# Patient Record
Sex: Female | Born: 1995 | Race: White | Hispanic: No | Marital: Single | Smoking: Former smoker
Health system: Southern US, Community
[De-identification: ages and names within clinical notes are randomized; demographics above are authoritative.]

## PROBLEM LIST (undated history)

## (undated) ENCOUNTER — Inpatient Hospital Stay (HOSPITAL_COMMUNITY): Payer: Self-pay

## (undated) DIAGNOSIS — F319 Bipolar disorder, unspecified: Secondary | ICD-10-CM

## (undated) DIAGNOSIS — F329 Major depressive disorder, single episode, unspecified: Secondary | ICD-10-CM

## (undated) DIAGNOSIS — F419 Anxiety disorder, unspecified: Secondary | ICD-10-CM

## (undated) DIAGNOSIS — F32A Depression, unspecified: Secondary | ICD-10-CM

## (undated) DIAGNOSIS — O1500 Eclampsia in pregnancy, unspecified trimester: Secondary | ICD-10-CM

## (undated) HISTORY — PX: WISDOM TOOTH EXTRACTION: SHX21

## (undated) HISTORY — DX: Eclampsia complicating pregnancy, unspecified trimester: O15.00

## (undated) HISTORY — DX: Anxiety disorder, unspecified: F41.9

## (undated) HISTORY — DX: Major depressive disorder, single episode, unspecified: F32.9

## (undated) HISTORY — DX: Depression, unspecified: F32.A

## (undated) HISTORY — DX: Bipolar disorder, unspecified: F31.9

---
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
## 2013-08-07 ENCOUNTER — Other Ambulatory Visit: Payer: Self-pay | Admitting: *Deleted

## 2013-08-07 MED ORDER — NORELGESTROMIN-ETH ESTRADIOL 150-35 MCG/24HR TD PTWK: MEDICATED_PATCH | TRANSDERMAL | Status: DC

## 2013-08-07 NOTE — Telephone Encounter (Signed)
LAST OV 10/13 WITH [facility]FS. I ONLY GAVE ONE PACKAGE. PLEASE PRINT FOR MAIL ORDER AND CHANGE QUANTITY IF YOU WANT TO GIVE MORE SINCE MAIL ORDER. THANKS.

## 2014-04-01 DIAGNOSIS — F319 Bipolar disorder, unspecified: Secondary | ICD-10-CM | POA: Insufficient documentation

## 2015-04-02 ENCOUNTER — Ambulatory Visit: Payer: Commercial Managed Care - PPO | Admitting: Sports Medicine

## 2015-10-12 ENCOUNTER — Encounter: Payer: Self-pay | Admitting: Family

## 2015-10-12 ENCOUNTER — Ambulatory Visit (INDEPENDENT_AMBULATORY_CARE_PROVIDER_SITE_OTHER): Payer: 59 | Admitting: Family

## 2015-10-12 VITALS — BP 124/72 | HR 89 | Ht 64.0 in | Wt 120.0 lb

## 2015-10-12 DIAGNOSIS — Z3491 Encounter for supervision of normal pregnancy, unspecified, first trimester: Secondary | ICD-10-CM

## 2015-10-12 DIAGNOSIS — Z3401 Encounter for supervision of normal first pregnancy, first trimester: Secondary | ICD-10-CM

## 2015-10-12 DIAGNOSIS — Z124 Encounter for screening for malignant neoplasm of cervix: Secondary | ICD-10-CM | POA: Diagnosis not present

## 2015-10-12 DIAGNOSIS — Z113 Encounter for screening for infections with a predominantly sexual mode of transmission: Secondary | ICD-10-CM

## 2015-10-12 DIAGNOSIS — Z3400 Encounter for supervision of normal first pregnancy, unspecified trimester: Secondary | ICD-10-CM | POA: Insufficient documentation

## 2015-10-12 NOTE — Progress Notes (Signed)
Pt here for initial prenatal visit and desires [facility]  Bedside US shows single IUP measuring 8047w3d with FHR 185

## 2015-10-12 NOTE — Patient Instructions (Signed)
First Trimester of Pregnancy The first trimester of pregnancy is from week 1 until the end of week 12 (months 1 through 3). A week after a sperm fertilizes an egg, the egg will implant on the wall of the uterus. This embryo will begin to develop into a baby. Genes from you and your partner are forming the baby. The female genes determine whether the baby is a boy or a girl. At 6-8 weeks, the eyes and face are formed, and the heartbeat can be seen on ultrasound. At the end of 12 weeks, all the baby's organs are formed.  Now that you are pregnant, you will want to do everything you can to have a healthy baby. Two of the most important things are to get good prenatal care and to follow your health care provider's instructions. Prenatal care is all the medical care you receive before the baby's birth. This care will help prevent, find, and treat any problems during the pregnancy and childbirth. BODY CHANGES Your body goes through many changes during pregnancy. The changes vary from woman to woman.   You may gain or lose a couple of pounds at first.  You may feel sick to your stomach (nauseous) and throw up (vomit). If the vomiting is uncontrollable, call your health care provider.  You may tire easily.  You may develop headaches that can be relieved by medicines approved by your health care provider.  You may urinate more often. Painful urination may mean you have a bladder infection.  You may develop heartburn as a result of your pregnancy.  You may develop constipation because certain hormones are causing the muscles that push waste through your intestines to slow down.  You may develop hemorrhoids or swollen, bulging veins (varicose veins).  Your breasts may begin to grow larger and become tender. Your nipples may stick out more, and the tissue that surrounds them (areola) may become darker.  Your gums may bleed and may be sensitive to brushing and flossing.  Dark spots or blotches (chloasma,  mask of pregnancy) may develop on your face. This will likely fade after the baby is born.  Your menstrual periods will stop.  You may have a loss of appetite.  You may develop cravings for certain kinds of food.  You may have changes in your emotions from day to day, such as being excited to be pregnant or being concerned that something may go wrong with the pregnancy and baby.  You may have more vivid and strange dreams.  You may have changes in your hair. These can include thickening of your hair, rapid growth, and changes in texture. Some women also have hair loss during or after pregnancy, or hair that feels dry or thin. Your hair will most likely return to normal after your baby is born. WHAT TO EXPECT AT YOUR PRENATAL VISITS During a routine prenatal visit:  You will be weighed to make sure you and the baby are growing normally.  Your blood pressure will be taken.  Your abdomen will be measured to track your baby's growth.  The fetal heartbeat will be listened to starting around week 10 or 12 of your pregnancy.  Test results from any previous visits will be discussed. Your health care provider may ask you:  How you are feeling.  If you are feeling the baby move.  If you have had any abnormal symptoms, such as leaking fluid, bleeding, severe headaches, or abdominal cramping.  If you are using any tobacco products,   including cigarettes, chewing tobacco, and electronic cigarettes.  If you have any questions. Other tests that may be performed during your first trimester include:  Blood tests to find your blood type and to check for the presence of any previous infections. They will also be used to check for low iron levels (anemia) and Rh antibodies. Later in the pregnancy, blood tests for diabetes will be done along with other tests if problems develop.  Urine tests to check for infections, diabetes, or protein in the urine.  An ultrasound to confirm the proper growth  and development of the baby.  An amniocentesis to check for possible genetic problems.  Fetal screens for spina bifida and Down syndrome.  You may need other tests to make sure you and the baby are doing well.  HIV (human immunodeficiency virus) testing. Routine prenatal testing includes screening for HIV, unless you choose not to have this test. HOME CARE INSTRUCTIONS  Medicines  Follow your health care provider's instructions regarding medicine use. Specific medicines may be either safe or unsafe to take during pregnancy.  Take your prenatal vitamins as directed.  If you develop constipation, try taking a stool softener if your health care provider approves. Diet  Eat regular, well-balanced meals. Choose a variety of foods, such as meat or vegetable-based protein, fish, milk and low-fat dairy products, vegetables, fruits, and whole grain breads and cereals. Your health care provider will help you determine the amount of weight gain that is right for you.  Avoid raw meat and uncooked cheese. These carry germs that can cause birth defects in the baby.  Eating four or five small meals rather than three large meals a day may help relieve nausea and vomiting. If you start to feel nauseous, eating a few soda crackers can be helpful. Drinking liquids between meals instead of during meals also seems to help nausea and vomiting.  If you develop constipation, eat more high-fiber foods, such as fresh vegetables or fruit and whole grains. Drink enough fluids to keep your urine clear or pale yellow. Activity and Exercise  Exercise only as directed by your health care provider. Exercising will help you:  Control your weight.  Stay in shape.  Be prepared for labor and delivery.  Experiencing pain or cramping in the lower abdomen or low back is a good sign that you should stop exercising. Check with your health care provider before continuing normal exercises.  Try to avoid standing for long  periods of time. Move your legs often if you must stand in one place for a long time.  Avoid heavy lifting.  Wear low-heeled shoes, and practice good posture.  You may continue to have sex unless your health care provider directs you otherwise. Relief of Pain or Discomfort  Wear a good support bra for breast tenderness.   Take warm sitz baths to soothe any pain or discomfort caused by hemorrhoids. Use hemorrhoid cream if your health care provider approves.   Rest with your legs elevated if you have leg cramps or low back pain.  If you develop varicose veins in your legs, wear support hose. Elevate your feet for 15 minutes, 3-4 times a day. Limit salt in your diet. Prenatal Care  Schedule your prenatal visits by the twelfth week of pregnancy. They are usually scheduled monthly at first, then more often in the last 2 months before delivery.  Write down your questions. Take them to your prenatal visits.  Keep all your prenatal visits as directed by your   health care provider. Safety  Wear your seat belt at all times when driving.  Make a list of emergency phone numbers, including numbers for family, friends, the hospital, and police and fire departments. General Tips  Ask your health care provider for a referral to a local prenatal education class. Begin classes no later than at the beginning of month 6 of your pregnancy.  Ask for help if you have counseling or nutritional needs during pregnancy. Your health care provider can offer advice or refer you to specialists for help with various needs.  Do not use hot tubs, steam rooms, or saunas.  Do not douche or use tampons or scented sanitary pads.  Do not cross your legs for long periods of time.  Avoid cat litter boxes and soil used by cats. These carry germs that can cause birth defects in the baby and possibly loss of the fetus by miscarriage or stillbirth.  Avoid all smoking, herbs, alcohol, and medicines not prescribed by  your health care provider. Chemicals in these affect the formation and growth of the baby.  Do not use any tobacco products, including cigarettes, chewing tobacco, and electronic cigarettes. If you need help quitting, ask your health care provider. You may receive counseling support and other resources to help you quit.  Schedule a dentist appointment. At home, brush your teeth with a soft toothbrush and be gentle when you floss. SEEK MEDICAL CARE IF:   You have dizziness.  You have mild pelvic cramps, pelvic pressure, or nagging pain in the abdominal area.  You have persistent nausea, vomiting, or diarrhea.  You have a bad smelling vaginal discharge.  You have pain with urination.  You notice increased swelling in your face, hands, legs, or ankles. SEEK IMMEDIATE MEDICAL CARE IF:   You have a fever.  You are leaking fluid from your vagina.  You have spotting or bleeding from your vagina.  You have severe abdominal cramping or pain.  You have rapid weight gain or loss.  You vomit blood or material that looks like coffee grounds.  You are exposed to German measles and have never had them.  You are exposed to fifth disease or chickenpox.  You develop a severe headache.  You have shortness of breath.  You have any kind of trauma, such as from a fall or a car accident.   This information is not intended to replace advice given to you by your health care provider. Make sure you discuss any questions you have with your health care provider.   Document Released: 09/20/2001 Document Revised: 10/17/2014 Document Reviewed: 08/06/2013 Elsevier Interactive Patient Education 2016 [employer]  

## 2015-10-12 NOTE — Progress Notes (Signed)
   Subjective:    Cynthia Carroll is a G1P0 6570w3d being seen today for her first obstetrical visit.  Her obstetrical history is significant for depression,not requiring meds.  . Patient does intend to breast feed. Pregnancy unexpected, positive support from her mother.  Here with FOB Cynthia Carroll.  Pregnancy history fully reviewed.  Patient reports nausea.  Filed Vitals:   10/12/15 0858 10/12/15 0903  BP: 124/72   Pulse: 89   Height:  5\' 4"  (1.626 m)  Weight: 120 lb (54.432 kg)     HISTORY: OB History  Gravida Para Term Preterm AB SAB TAB Ectopic Multiple Living  1             # Outcome Date GA Lbr Len/2nd Weight Sex Delivery Anes PTL Lv  1 Current              Past Medical History  Diagnosis Date  . Anxiety   . Depression    Past Surgical History  Procedure Laterality Date  . Myringotomy     Family History  Problem Relation Age of Onset  . Heart disease Maternal Grandfather      Exam   BP 124/72 mmHg  Pulse 89  Ht 5\' 4"  (1.626 m)  Wt 120 lb (54.432 kg)  BMI 20.59 kg/m2 Uterine Size: size equals dates  Pelvic Exam:    Perineum: No Hemorrhoids, Normal Perineum   Vulva: normal   Vagina:  normal mucosa, normal discharge, no palpable nodules   pH: Not done   Cervix: no bleeding following Pap, no cervical motion tenderness and no lesions   Adnexa: normal adnexa and no mass, fullness, tenderness   Bony Pelvis: Adequate  System: Breast:  No nipple retraction or dimpling, No nipple discharge or bleeding, No axillary or supraclavicular adenopathy, Normal to palpation without dominant masses   Skin: normal coloration and turgor, no rashes    Neurologic: negative   Extremities: normal strength, tone, and muscle mass   HEENT neck supple with midline trachea and thyroid without masses   Mouth/Teeth mucous membranes moist, pharynx normal without lesions   Neck supple and no masses   Cardiovascular: regular rate and rhythm, no murmurs or gallops   Respiratory:  appears  well, vitals normal, no respiratory distress, acyanotic, normal RR, neck free of mass or lymphadenopathy, chest clear, no wheezing, crepitations, rhonchi, normal symmetric air entry   Abdomen: soft, non-tender; bowel sounds normal; no masses,  no organomegaly   Urinary: urethral meatus normal       Assessment:    Pregnancy: G1P0 Patient Active Problem List   Diagnosis Date Noted  . Supervision of normal first pregnancy, antepartum 10/12/2015     Plan:     Initial labs drawn. Prenatal vitamins. Problem list reviewed and updated. Genetic Screening discussed First Screen: ordered.  Follow up in 4 weeks.   Marlis EdelsonKARIM, WALIDAH N 10/12/2015

## 2015-10-14 LAB — GC/CHLAMYDIA PROBE AMP (~~LOC~~) NOT AT ARMC
Chlamydia: POSITIVE — AB
Neisseria Gonorrhea: NEGATIVE

## 2015-10-15 ENCOUNTER — Telehealth: Payer: Self-pay | Admitting: *Deleted

## 2015-10-15 DIAGNOSIS — A749 Chlamydial infection, unspecified: Secondary | ICD-10-CM

## 2015-10-15 LAB — PRENATAL PROFILE (SOLSTAS)
Antibody Screen: NEGATIVE
Basophils Absolute: 0 10*3/uL (ref 0.0–0.1)
Basophils Relative: 0 % (ref 0–1)
Eosinophils Absolute: 0 10*3/uL (ref 0.0–0.7)
Eosinophils Relative: 0 % (ref 0–5)
HCT: 39 % (ref 36.0–46.0)
HIV 1&2 Ab, 4th Generation: NONREACTIVE
Hemoglobin: 13.4 g/dL (ref 12.0–15.0)
Hepatitis B Surface Ag: NEGATIVE
Lymphocytes Relative: 13 % (ref 12–46)
Lymphs Abs: 1.3 10*3/uL (ref 0.7–4.0)
MCH: 30.9 pg (ref 26.0–34.0)
MCHC: 34.4 g/dL (ref 30.0–36.0)
MCV: 90.1 fL (ref 78.0–100.0)
MPV: 8.4 fL — ABNORMAL LOW (ref 8.6–12.4)
Monocytes Absolute: 0.5 10*3/uL (ref 0.1–1.0)
Monocytes Relative: 5 % (ref 3–12)
Neutro Abs: 8.1 10*3/uL — ABNORMAL HIGH (ref 1.7–7.7)
Neutrophils Relative %: 82 % — ABNORMAL HIGH (ref 43–77)
Platelets: 425 10*3/uL — ABNORMAL HIGH (ref 150–400)
RBC: 4.33 MIL/uL (ref 3.87–5.11)
RDW: 13 % (ref 11.5–15.5)
Rh Type: POSITIVE
Rubella: 2.02 Index — ABNORMAL HIGH (ref <0.90)
WBC: 9.9 10*3/uL (ref 4.0–10.5)

## 2015-10-15 MED ORDER — AZITHROMYCIN 500 MG PO TABS: 1000.0000 mg | ORAL_TABLET | Freq: Once | ORAL | Status: DC

## 2015-10-15 NOTE — Telephone Encounter (Signed)
Called pt to adv positive for Chlamydia - VM not set up, unable to leave message. Sent meds to pharmacy

## 2015-10-16 ENCOUNTER — Telehealth: Payer: Self-pay | Admitting: Family

## 2015-10-16 LAB — CULTURE, OB URINE: Colony Count: >=100000

## 2015-10-16 LAB — CYTOLOGY - PAP

## 2015-10-16 MED ORDER — CEPHALEXIN 500 MG PO CAPS: 500.0000 mg | ORAL_CAPSULE | Freq: Three times a day (TID) | ORAL | Status: DC

## 2015-10-16 NOTE — Telephone Encounter (Signed)
Patient called to inform regarding +chlamydia and UTI.  Unable to leave voicemail due to it not being set-up.

## 2015-10-21 ENCOUNTER — Other Ambulatory Visit: Payer: Self-pay | Admitting: Family

## 2015-10-21 ENCOUNTER — Telehealth: Payer: Self-pay | Admitting: *Deleted

## 2015-10-21 DIAGNOSIS — N390 Urinary tract infection, site not specified: Secondary | ICD-10-CM

## 2015-10-21 MED ORDER — CEPHALEXIN 500 MG PO CAPS: 500.0000 mg | ORAL_CAPSULE | Freq: Three times a day (TID) | ORAL | Status: DC

## 2015-10-21 NOTE — Telephone Encounter (Signed)
Pt called stating that her keflex was not at the pharmacy.  It appears that it was put in as a phone in.  I resent it using escripts per Brion AlimentWaliday Karim, CNM

## 2015-10-26 ENCOUNTER — Encounter: Payer: Self-pay | Admitting: Family

## 2015-10-26 DIAGNOSIS — A749 Chlamydial infection, unspecified: Secondary | ICD-10-CM | POA: Insufficient documentation

## 2015-10-26 DIAGNOSIS — O2341 Unspecified infection of urinary tract in pregnancy, first trimester: Secondary | ICD-10-CM | POA: Insufficient documentation

## 2015-10-26 DIAGNOSIS — O98811 Other maternal infectious and parasitic diseases complicating pregnancy, first trimester: Secondary | ICD-10-CM

## 2015-11-09 ENCOUNTER — Encounter: Payer: 59 | Admitting: Advanced Practice Midwife

## 2015-11-09 ENCOUNTER — Ambulatory Visit (INDEPENDENT_AMBULATORY_CARE_PROVIDER_SITE_OTHER): Payer: 59 | Admitting: Obstetrics & Gynecology

## 2015-11-09 ENCOUNTER — Encounter: Payer: Self-pay | Admitting: Obstetrics & Gynecology

## 2015-11-09 VITALS — BP 119/70 | HR 103 | Wt 122.0 lb

## 2015-11-09 DIAGNOSIS — Z3401 Encounter for supervision of normal first pregnancy, first trimester: Secondary | ICD-10-CM
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
## 2015-11-09 NOTE — Patient Instructions (Signed)

## 2015-11-10 ENCOUNTER — Ambulatory Visit (HOSPITAL_COMMUNITY): Payer: 59

## 2015-11-10 ENCOUNTER — Other Ambulatory Visit (HOSPITAL_COMMUNITY): Payer: 59

## 2015-12-21 ENCOUNTER — Other Ambulatory Visit: Payer: Self-pay | Admitting: Obstetrics & Gynecology

## 2015-12-21 ENCOUNTER — Ambulatory Visit (HOSPITAL_COMMUNITY)
Admission: RE | Admit: 2015-12-21 | Discharge: 2015-12-21 | Disposition: A | Discharge status: Home / Self Care | Payer: 59 | Source: Ambulatory Visit | Attending: Obstetrics & Gynecology | Admitting: Obstetrics & Gynecology

## 2015-12-21 DIAGNOSIS — Z36 Encounter for antenatal screening of mother: Secondary | ICD-10-CM | POA: Diagnosis present

## 2015-12-21 DIAGNOSIS — Z3401 Encounter for supervision of normal first pregnancy, first trimester: Secondary | ICD-10-CM

## 2015-12-21 DIAGNOSIS — Z3A18 18 weeks gestation of pregnancy: Secondary | ICD-10-CM | POA: Diagnosis not present

## 2016-01-04 ENCOUNTER — Encounter (HOSPITAL_COMMUNITY): Payer: Self-pay | Admitting: *Deleted

## 2016-01-04 ENCOUNTER — Inpatient Hospital Stay (HOSPITAL_COMMUNITY)
Admission: AD | Admit: 2016-01-04 | Discharge: 2016-01-04 | Disposition: A | Discharge status: Home / Self Care | Payer: 59 | Source: Ambulatory Visit | Attending: Family Medicine | Admitting: Family Medicine

## 2016-01-04 ENCOUNTER — Ambulatory Visit (INDEPENDENT_AMBULATORY_CARE_PROVIDER_SITE_OTHER): Payer: 59 | Admitting: Obstetrics & Gynecology

## 2016-01-04 VITALS — BP 121/66 | HR 99 | Wt 130.0 lb

## 2016-01-04 DIAGNOSIS — F419 Anxiety disorder, unspecified: Secondary | ICD-10-CM | POA: Insufficient documentation

## 2016-01-04 DIAGNOSIS — R109 Unspecified abdominal pain: Secondary | ICD-10-CM | POA: Diagnosis present

## 2016-01-04 DIAGNOSIS — F329 Major depressive disorder, single episode, unspecified: Secondary | ICD-10-CM | POA: Insufficient documentation

## 2016-01-04 DIAGNOSIS — Z3A20 20 weeks gestation of pregnancy: Secondary | ICD-10-CM | POA: Diagnosis not present

## 2016-01-04 DIAGNOSIS — O99332 Smoking (tobacco) complicating pregnancy, second trimester: Secondary | ICD-10-CM | POA: Diagnosis not present

## 2016-01-04 DIAGNOSIS — O2342 Unspecified infection of urinary tract in pregnancy, second trimester: Secondary | ICD-10-CM | POA: Diagnosis not present

## 2016-01-04 DIAGNOSIS — Z3402 Encounter for supervision of normal first pregnancy, second trimester: Secondary | ICD-10-CM

## 2016-01-04 LAB — URINALYSIS, ROUTINE W REFLEX MICROSCOPIC
Bilirubin Urine: NEGATIVE
Glucose, UA: NEGATIVE mg/dL
KETONES UR: NEGATIVE mg/dL
NITRITE: POSITIVE — AB
Protein, ur: NEGATIVE mg/dL
SPECIFIC GRAVITY, URINE: 1.02 (ref 1.005–1.030)
pH: 6.5 (ref 5.0–8.0)

## 2016-01-04 LAB — URINE MICROSCOPIC-ADD ON

## 2016-01-04 MED ORDER — NITROFURANTOIN MONOHYD MACRO 100 MG PO CAPS: 100.0000 mg | ORAL_CAPSULE | Freq: Two times a day (BID) | ORAL | Status: DC

## 2016-01-04 MED ORDER — NITROFURANTOIN MONOHYD MACRO 100 MG PO CAPS
100.0000 mg | ORAL_CAPSULE | Freq: Once | ORAL | Status: AC
  Administered 2016-01-04: 100 mg via ORAL
  Filled 2016-01-04: qty 1

## 2016-01-04 NOTE — MAU Note (Signed)
Pt was at doctor today but c/o right mid back pain that started after doctors appointment. Pt denies urinary s/s. Denies vaginal bleeding or abdominal pain. Rates 8/10-has not taken anything for pain.

## 2016-01-04 NOTE — Discharge Instructions (Signed)

## 2016-01-04 NOTE — MAU Provider Note (Signed)
History   G1 @ 20.3 wks started having rt flank pain apx 1530 today. Denies abd pain or vag bleeding. Denies urinary freq or pain with urination. States pain has improved since coming to hospital.  CSN: [id number]  Arrival date & time 01/04/16  1905   First Provider Initiated Contact with Patient 01/04/16 1937      Chief Complaint  Patient presents with  . Back Pain    HPI  Past Medical History  Diagnosis Date  . Anxiety   . Depression     Past Surgical History  Procedure Laterality Date  . Myringotomy      Family History  Problem Relation Age of Onset  . Heart disease Maternal Grandfather     Social History  Substance Use Topics  . Smoking status: Current Every Day Smoker  . Smokeless tobacco: Never Used  . Alcohol Use: No    OB History    Gravida Para Term Preterm AB TAB SAB Ectopic Multiple Living   1               Review of Systems  Constitutional: Negative.   HENT: Negative.   Eyes: Negative.   Respiratory: Negative.   Cardiovascular: Negative.   Gastrointestinal: Negative.   Endocrine: Negative.   Genitourinary: Positive for flank pain.  Musculoskeletal: Negative.   Skin: Negative.   Allergic/Immunologic: Negative.   Neurological: Negative.   Hematological: Negative.   Psychiatric/Behavioral: Negative.     Allergies  Review of patient's allergies indicates no known allergies.  Home Medications  No current outpatient prescriptions on file.  BP 115/55 mmHg  Pulse 91  Temp(Src) 98.3 F (36.8 C) (Oral)  Resp 18  Ht 5\' 4"  (1.626 m)  Wt 131 lb 9.6 oz (59.693 kg)  BMI 22.58 kg/m2  SpO2 100%  Physical Exam  Constitutional: She is oriented to person, place, and time. She appears well-developed and well-nourished.  HENT:  Head: Normocephalic.  Eyes: Pupils are equal, round, and reactive to light.  Neck: Normal range of motion.  Cardiovascular: Normal rate, regular rhythm, normal heart sounds and intact distal pulses.   Pulmonary/Chest:  Effort normal and breath sounds normal.  Abdominal: Soft. Bowel sounds are normal.  Genitourinary: Vagina normal and uterus normal.  Musculoskeletal: Normal range of motion.  Neurological: She is alert and oriented to person, place, and time. She has normal reflexes.  Skin: Skin is warm and dry.  Psychiatric: She has a normal mood and affect. Her behavior is normal. Judgment and thought content normal.    MAU Course  Procedures (including critical care time)  Labs Reviewed  URINALYSIS, ROUTINE W REFLEX MICROSCOPIC [facility])   No results found.   No diagnosis found.    MDM  UTI Nitrite pos will treat and send culture

## 2016-01-04 NOTE — Progress Notes (Signed)
Subjective:  Cynthia Carroll is a 20 y.o. G1P0 at 3373w3d being seen today for ongoing prenatal care.  She is currently monitored for the following issues for this low-risk pregnancy and has Supervision of normal first pregnancy, antepartum; Chlamydia infection affecting pregnancy in first trimester, antepartum; and UTI (urinary tract infection) in pregnancy in first trimester on her problem list.  Patient reports constipation.  Contractions: Not present. Vag. Bleeding: None.  Movement: Increased. Denies leaking of fluid.   The following portions of the patient's history were reviewed and updated as appropriate: allergies, current medications, past family history, past medical history, past social history, past surgical history and problem list. Problem list updated.  Objective:   Filed Vitals:   01/04/16 1431  BP: 121/66  Pulse: 99  Weight: 130 lb (58.968 kg)    Fetal Status: Fetal Heart Rate (bpm): 161   Movement: Increased     General:  Alert, oriented and cooperative. Patient is in no acute distress.  Skin: Skin is warm and dry. No rash noted.   Cardiovascular: Normal heart rate noted  Respiratory: Normal respiratory effort, no problems with respiration noted  Abdomen: Soft, gravid, appropriate for gestational age. Pain/Pressure: Absent     Pelvic: Vag. Bleeding: None Vag D/C Character: Thin   Cervical exam deferred        Extremities: Normal range of motion.  Edema: Trace  Mental Status: Normal mood and affect. Normal behavior. Normal judgment and thought content.   Urinalysis: Urine Protein: Negative Urine Glucose: Negative  Assessment and Plan:  Pregnancy: G1P0 at 7373w3d  1. Supervision of normal first pregnancy, antepartum, second trimester - follow up anatomy u/s  Preterm labor symptoms and general obstetric precautions including but not limited to vaginal bleeding, contractions, leaking of fluid and fetal movement were reviewed in detail with the patient. Please refer  to After Visit Summary for other counseling recommendations.  Return in about 8 weeks (around 02/29/2016).   Allie BossierMyra C Lineth Thielke, MD

## 2016-01-04 NOTE — Progress Notes (Signed)
Only c/o today is constipation.  Suggested Colace and "lots of H2O"

## 2016-02-02 ENCOUNTER — Ambulatory Visit (HOSPITAL_COMMUNITY)
Admission: RE | Admit: 2016-02-02 | Discharge: 2016-02-02 | Disposition: A | Discharge status: Home / Self Care | Payer: 59 | Source: Ambulatory Visit | Attending: Obstetrics & Gynecology | Admitting: Obstetrics & Gynecology

## 2016-02-02 ENCOUNTER — Other Ambulatory Visit: Payer: Self-pay | Admitting: Obstetrics & Gynecology

## 2016-02-02 DIAGNOSIS — Z3402 Encounter for supervision of normal first pregnancy, second trimester: Secondary | ICD-10-CM

## 2016-02-02 DIAGNOSIS — Z0489 Encounter for examination and observation for other specified reasons: Secondary | ICD-10-CM

## 2016-02-02 DIAGNOSIS — Z36 Encounter for antenatal screening of mother: Secondary | ICD-10-CM | POA: Diagnosis not present

## 2016-02-02 DIAGNOSIS — IMO0002 Reserved for concepts with insufficient information to code with codable children: Secondary | ICD-10-CM

## 2016-02-02 DIAGNOSIS — Z3A24 24 weeks gestation of pregnancy: Secondary | ICD-10-CM | POA: Insufficient documentation

## 2016-02-12 ENCOUNTER — Telehealth: Payer: Self-pay

## 2016-02-12 NOTE — Telephone Encounter (Signed)
Patient called and states for last 4 hours she has had some contant cramping feeling in her lower abdomen- patient denies any bleeding , leaking of fluid, and states baby is moving well today. Patient encouraged to increase fluid intake and rest to see if this alleviates the cramping- if symptoms worsen [facility]. Asked patient if she took all of her anitbiotic from 01-04-16 when she was dx with UTI- patient states yes. Armandina StammerJennifer Howard RN BSN

## 2016-02-29 ENCOUNTER — Ambulatory Visit (INDEPENDENT_AMBULATORY_CARE_PROVIDER_SITE_OTHER): Payer: 59 | Admitting: Obstetrics & Gynecology

## 2016-02-29 VITALS — BP 123/78 | HR 104 | Wt 152.0 lb

## 2016-02-29 DIAGNOSIS — A749 Chlamydial infection, unspecified: Secondary | ICD-10-CM

## 2016-02-29 DIAGNOSIS — Z23 Encounter for immunization: Secondary | ICD-10-CM | POA: Diagnosis not present

## 2016-02-29 DIAGNOSIS — Z36 Encounter for antenatal screening of mother: Secondary | ICD-10-CM | POA: Diagnosis not present

## 2016-02-29 DIAGNOSIS — O98812 Other maternal infectious and parasitic diseases complicating pregnancy, second trimester: Secondary | ICD-10-CM

## 2016-02-29 DIAGNOSIS — Z113 Encounter for screening for infections with a predominantly sexual mode of transmission: Secondary | ICD-10-CM

## 2016-02-29 DIAGNOSIS — O98312 Other infections with a predominantly sexual mode of transmission complicating pregnancy, second trimester: Secondary | ICD-10-CM

## 2016-02-29 DIAGNOSIS — Z3403 Encounter for supervision of normal first pregnancy, third trimester: Secondary | ICD-10-CM

## 2016-02-29 NOTE — Progress Notes (Signed)
Subjective:  Cynthia Carroll is a 20 y.o. G1P0 at 7055w3d being seen today for ongoing prenatal care.  She is currently monitored for the following issues for this low-risk pregnancy and has Supervision of normal first pregnancy, antepartum; Chlamydia infection affecting pregnancy in first trimester, antepartum; and UTI (urinary tract infection) in pregnancy in first trimester on her problem list.  Patient reports no complaints.   .  .   . Denies leaking of fluid.   The following portions of the patient's history were reviewed and updated as appropriate: allergies, current medications, past family history, past medical history, past social history, past surgical history and problem list. Problem list updated.  Objective:  There were no vitals filed for this visit.  Fetal Status:           General:  Alert, oriented and cooperative. Patient is in no acute distress.  Skin: Skin is warm and dry. No rash noted.   Cardiovascular: Normal heart rate noted  Respiratory: Normal respiratory effort, no problems with respiration noted  Abdomen: Soft, gravid, appropriate for gestational age.       Pelvic:       Cervical exam deferred        Extremities: Normal range of motion.     Mental Status: Normal mood and affect. Normal behavior. Normal judgment and thought content.   Urinalysis:      Assessment and Plan:  Pregnancy: G1P0 at 8055w3d  1. Supervision of normal first pregnancy, antepartum, third trimester  - HIV antibody - CBC - Glucose Tolerance, 1 HR (50g) - RPR  2. Chlamydia infection affecting pregnancy in second trimester, antepartum  - GC/Chlamydia probe amp ([place])not at [facility] 3. Audible decel (lasted less than 5 seconds, down to 100)- NST Kick counts reviewed  Preterm labor symptoms and general obstetric precautions including but not limited to vaginal bleeding, contractions, leaking of fluid and fetal movement were reviewed in detail with the patient. Please refer to After  Visit Summary for other counseling recommendations.  Return in about 4 weeks (around 03/28/2016).   Allie BossierMyra C Leoni Goodness, MD

## 2016-03-01 LAB — HIV ANTIBODY (ROUTINE TESTING W REFLEX): HIV: NONREACTIVE

## 2016-03-01 LAB — CBC
HCT: 32.4 % — ABNORMAL LOW (ref 35.0–45.0)
Hemoglobin: 10.9 g/dL — ABNORMAL LOW (ref 11.7–15.5)
MCH: 31.7 pg (ref 27.0–33.0)
MCHC: 33.6 g/dL (ref 32.0–36.0)
MCV: 94.2 fL (ref 80.0–100.0)
MPV: 9 fL (ref 7.5–12.5)
PLATELETS: 212 10*3/uL (ref 140–400)
RBC: 3.44 MIL/uL — ABNORMAL LOW (ref 3.80–5.10)
RDW: 13 % (ref 11.0–15.0)
WBC: 9.4 10*3/uL (ref 3.8–10.8)

## 2016-03-01 LAB — GLUCOSE TOLERANCE, 1 HOUR (50G) W/O FASTING: Glucose, 1 Hr, gestational: 98 mg/dL (ref <140)

## 2016-03-02 ENCOUNTER — Telehealth: Payer: Self-pay | Admitting: *Deleted

## 2016-03-02 ENCOUNTER — Encounter: Payer: Self-pay | Admitting: Advanced Practice Midwife

## 2016-03-02 LAB — GC/CHLAMYDIA PROBE AMP (~~LOC~~) NOT AT ARMC
CHLAMYDIA, DNA PROBE: NEGATIVE
Neisseria Gonorrhea: NEGATIVE

## 2016-03-02 LAB — RPR

## 2016-03-02 NOTE — Telephone Encounter (Signed)
LM on voicemail of normal 1 hr GTT. 

## 2016-03-16 ENCOUNTER — Encounter (HOSPITAL_COMMUNITY): Payer: Self-pay | Admitting: *Deleted

## 2016-03-16 ENCOUNTER — Inpatient Hospital Stay (HOSPITAL_COMMUNITY)
Admission: AD | Admit: 2016-03-16 | Discharge: 2016-03-16 | Disposition: A | Discharge status: Home / Self Care | Payer: 59 | Source: Ambulatory Visit | Attending: Obstetrics and Gynecology | Admitting: Obstetrics and Gynecology

## 2016-03-16 DIAGNOSIS — Z3403 Encounter for supervision of normal first pregnancy, third trimester: Secondary | ICD-10-CM

## 2016-03-16 DIAGNOSIS — F419 Anxiety disorder, unspecified: Secondary | ICD-10-CM | POA: Diagnosis not present

## 2016-03-16 DIAGNOSIS — O2341 Unspecified infection of urinary tract in pregnancy, first trimester: Secondary | ICD-10-CM

## 2016-03-16 DIAGNOSIS — N390 Urinary tract infection, site not specified: Secondary | ICD-10-CM | POA: Insufficient documentation

## 2016-03-16 DIAGNOSIS — M549 Dorsalgia, unspecified: Secondary | ICD-10-CM | POA: Insufficient documentation

## 2016-03-16 DIAGNOSIS — A749 Chlamydial infection, unspecified: Secondary | ICD-10-CM

## 2016-03-16 DIAGNOSIS — O26893 Other specified pregnancy related conditions, third trimester: Secondary | ICD-10-CM | POA: Diagnosis not present

## 2016-03-16 DIAGNOSIS — Z3A30 30 weeks gestation of pregnancy: Secondary | ICD-10-CM | POA: Insufficient documentation

## 2016-03-16 DIAGNOSIS — O99343 Other mental disorders complicating pregnancy, third trimester: Secondary | ICD-10-CM | POA: Diagnosis not present

## 2016-03-16 DIAGNOSIS — O2343 Unspecified infection of urinary tract in pregnancy, third trimester: Secondary | ICD-10-CM | POA: Diagnosis not present

## 2016-03-16 DIAGNOSIS — O98811 Other maternal infectious and parasitic diseases complicating pregnancy, first trimester: Secondary | ICD-10-CM

## 2016-03-16 DIAGNOSIS — O99333 Smoking (tobacco) complicating pregnancy, third trimester: Secondary | ICD-10-CM | POA: Insufficient documentation

## 2016-03-16 LAB — URINE MICROSCOPIC-ADD ON

## 2016-03-16 LAB — URINALYSIS, ROUTINE W REFLEX MICROSCOPIC
BILIRUBIN URINE: NEGATIVE
Glucose, UA: NEGATIVE mg/dL
KETONES UR: NEGATIVE mg/dL
Nitrite: POSITIVE — AB
PROTEIN: NEGATIVE mg/dL
Specific Gravity, Urine: 1.02 (ref 1.005–1.030)
pH: 6 (ref 5.0–8.0)

## 2016-03-16 MED ORDER — NITROFURANTOIN MONOHYD MACRO 100 MG PO CAPS: 100.0000 mg | ORAL_CAPSULE | Freq: Two times a day (BID) | ORAL | Status: DC

## 2016-03-16 NOTE — MAU Provider Note (Signed)
MAU HISTORY AND PHYSICAL  Chief Complaint:  Back Pain   Cynthia Carroll is a 20 y.o.  G1P0 with IUP at 10072w5d presenting for Back Pain Prenatal risk factors: prior UTI during pregnancies, positive chlamydia treated 1st trimester (self and partner) Pt states that today at around 6:30AM was awakened due to left sided back pain, constant, 7/10, sharp/cramping.  Pt reports that this pain is similar to a prior episode she had with UTI treated with macrobid successfully.  Denies any n/v, fever, LOF, VB, h/o kidney stones, RUQ pain, HA, CP, SOB, painful intercourse. Last sexual contact <12 hours ago.  Past Medical History  Diagnosis Date  . Anxiety   . Depression     Past Surgical History  Procedure Laterality Date  . No past surgeries      Family History  Problem Relation Age of Onset  . Heart disease Maternal Grandfather     Social History  Substance Use Topics  . Smoking status: Current Every Day Smoker -- 0.50 packs/day  . Smokeless tobacco: Never Used  . Alcohol Use: No    No Known Allergies  Prescriptions prior to admission  Medication Sig Dispense Refill Last Dose  . prenatal vitamin w/FE, FA (NATACHEW) 29-1 MG CHEW chewable tablet Chew 1 tablet by mouth daily at 12 noon.    03/15/2016 at Unknown time    Review of Systems - Negative except for what is mentioned in HPI.  Physical Exam  Blood pressure 121/75, pulse 87, temperature 98.1 F (36.7 C), temperature source Oral, resp. rate 18, weight 159 lb (72.122 kg). GENERAL: Well-developed, well-nourished female in no acute distress.  LUNGS: Clear to auscultation bilaterally.  HEART: Regular rate and rhythm. ABDOMEN: Soft, nontender, nondistended, gravid.  EXTREMITIES: Nontender, no edema, 2+ distal pulses. Cervical Exam: Closed/thick/anterior Presentation: cephalic FHT:  Baseline 145, +acels, no decels-reactive Contractions: None   Labs: Results for orders placed or performed during the hospital encounter of  03/16/16 (from the past 24 hour(s))  Urinalysis, Routine w reflex microscopic (not at [facility])   Collection Time: 03/16/16  9:40 AM  Result Value Ref Range   Color, Urine YELLOW YELLOW   APPearance CLOUDY (A) CLEAR   Specific Gravity, Urine 1.020 1.005 - 1.030   pH 6.0 5.0 - 8.0   Glucose, UA NEGATIVE NEGATIVE mg/dL   Hgb urine dipstick LARGE (A) NEGATIVE   Bilirubin Urine NEGATIVE NEGATIVE   Ketones, ur NEGATIVE NEGATIVE mg/dL   Protein, ur NEGATIVE NEGATIVE mg/dL   Nitrite POSITIVE (A) NEGATIVE   Leukocytes, UA MODERATE (A) NEGATIVE  Urine microscopic-add on   Collection Time: 03/16/16  9:40 AM  Result Value Ref Range   Squamous Epithelial / LPF 0-5 (A) NONE SEEN   WBC, UA 6-30 0 - 5 WBC/hpf   RBC / HPF 6-30 0 - 5 RBC/hpf   Bacteria, UA FEW (A) NONE SEEN    Imaging Studies:  No results found.  Assessment: Cynthia Carroll is  20 y.o. G1P0 at 8072w5d presents with Back Pain .  Plan: #UTI -Start macrobid x 5 days -Urine culture sent, to be followed up by Ob PCP -Reactive strip and closed cervical exam, r/o any pre-term labor at this time -Discussed labor precautions, drinking plenty of water, urinating after intercourse, avoiding douching or other products near vagina with fragrance or dyes.   Olena LeatherwoodKelly M Arvid Marengo 6/7/201711:23 AM

## 2016-03-16 NOTE — MAU Note (Signed)
Awoke with pain in mid back, left side today.  Now is spreading down to abd.  Had pain like this at 17 wks when had a UTI

## 2016-03-16 NOTE — Discharge Instructions (Signed)
Asymptomatic Bacteriuria, Female Asymptomatic bacteriuria is the presence of a large number of bacteria in your urine without the usual symptoms of burning or frequent urination. The following conditions increase the risk of asymptomatic bacteriuria:  Diabetes mellitus.  Advanced age.  Pregnancy in the first trimester.  Kidney stones.  Kidney transplants.  Leaky kidney tube valve in young children (reflux). Treatment for this condition is not needed in most people and can lead to other problems such as too much yeast and growth of resistant bacteria. However, some people, such as pregnant women, do need treatment to prevent kidney infection. Asymptomatic bacteriuria in pregnancy is also associated with fetal growth restriction, premature labor, and newborn death. HOME CARE INSTRUCTIONS Monitor your condition for any changes. The following actions may help to relieve any discomfort you are feeling:  Drink enough water and fluids to keep your urine clear or pale yellow. Go to the bathroom more often to keep your bladder empty.  Keep the area around your vagina and rectum clean. Wipe yourself from front to back after urinating. SEEK IMMEDIATE MEDICAL CARE IF:  You develop signs of an infection such as:  Burning with urination.  Frequency of voiding.  Back pain.  Fever.  You have blood in the urine.  You develop a fever. MAKE SURE YOU:  Understand these instructions.  Will watch your condition.  Will get help right away if you are not doing well or get worse.   This information is not intended to replace advice given to you by your health care provider. Make sure you discuss any questions you have with your health care provider.   Document Released: 09/26/2005 Document Revised: 10/17/2014 Document Reviewed: 03/18/2013 Elsevier Interactive Patient Education 2016 Elsevier Inc.  

## 2016-03-18 LAB — CULTURE, OB URINE

## 2016-03-28 ENCOUNTER — Ambulatory Visit (INDEPENDENT_AMBULATORY_CARE_PROVIDER_SITE_OTHER): Payer: 59 | Admitting: Obstetrics & Gynecology

## 2016-03-28 ENCOUNTER — Inpatient Hospital Stay (HOSPITAL_COMMUNITY)
Admission: AD | Admit: 2016-03-28 | Discharge: 2016-03-28 | Disposition: A | Discharge status: Home / Self Care | Payer: 59 | Source: Ambulatory Visit | Attending: Obstetrics & Gynecology | Admitting: Obstetrics & Gynecology

## 2016-03-28 ENCOUNTER — Encounter (HOSPITAL_COMMUNITY): Payer: Self-pay | Admitting: *Deleted

## 2016-03-28 ENCOUNTER — Telehealth: Payer: Self-pay | Admitting: Family Medicine

## 2016-03-28 VITALS — BP 147/99 | HR 87 | Wt 162.0 lb

## 2016-03-28 DIAGNOSIS — I10 Essential (primary) hypertension: Secondary | ICD-10-CM | POA: Diagnosis present

## 2016-03-28 DIAGNOSIS — O1403 Mild to moderate pre-eclampsia, third trimester: Secondary | ICD-10-CM | POA: Diagnosis not present

## 2016-03-28 DIAGNOSIS — O99333 Smoking (tobacco) complicating pregnancy, third trimester: Secondary | ICD-10-CM | POA: Diagnosis not present

## 2016-03-28 DIAGNOSIS — O1400 Mild to moderate pre-eclampsia, unspecified trimester: Secondary | ICD-10-CM | POA: Diagnosis present

## 2016-03-28 DIAGNOSIS — A749 Chlamydial infection, unspecified: Secondary | ICD-10-CM

## 2016-03-28 DIAGNOSIS — Z3A32 32 weeks gestation of pregnancy: Secondary | ICD-10-CM | POA: Insufficient documentation

## 2016-03-28 DIAGNOSIS — O1493 Unspecified pre-eclampsia, third trimester: Secondary | ICD-10-CM

## 2016-03-28 DIAGNOSIS — O2341 Unspecified infection of urinary tract in pregnancy, first trimester: Secondary | ICD-10-CM

## 2016-03-28 DIAGNOSIS — F17210 Nicotine dependence, cigarettes, uncomplicated: Secondary | ICD-10-CM | POA: Diagnosis not present

## 2016-03-28 DIAGNOSIS — O98811 Other maternal infectious and parasitic diseases complicating pregnancy, first trimester: Secondary | ICD-10-CM

## 2016-03-28 DIAGNOSIS — Z3402 Encounter for supervision of normal first pregnancy, second trimester: Secondary | ICD-10-CM

## 2016-03-28 DIAGNOSIS — Z3403 Encounter for supervision of normal first pregnancy, third trimester: Secondary | ICD-10-CM

## 2016-03-28 LAB — COMPREHENSIVE METABOLIC PANEL
ALT: 13 U/L — ABNORMAL LOW (ref 14–54)
AST: 16 U/L (ref 15–41)
Albumin: 2.9 g/dL — ABNORMAL LOW (ref 3.5–5.0)
Alkaline Phosphatase: 143 U/L — ABNORMAL HIGH (ref 38–126)
Anion gap: 10 (ref 5–15)
BUN: 10 mg/dL (ref 6–20)
CHLORIDE: 105 mmol/L (ref 101–111)
CO2: 22 mmol/L (ref 22–32)
Calcium: 8.8 mg/dL — ABNORMAL LOW (ref 8.9–10.3)
Creatinine, Ser: 0.61 mg/dL (ref 0.44–1.00)
Glucose, Bld: 97 mg/dL (ref 65–99)
POTASSIUM: 3.6 mmol/L (ref 3.5–5.1)
SODIUM: 137 mmol/L (ref 135–145)
Total Bilirubin: 0.2 mg/dL — ABNORMAL LOW (ref 0.3–1.2)
Total Protein: 7 g/dL (ref 6.5–8.1)

## 2016-03-28 LAB — CBC
HEMATOCRIT: 33.6 % — AB (ref 36.0–46.0)
Hemoglobin: 11.5 g/dL — ABNORMAL LOW (ref 12.0–15.0)
MCH: 31.6 pg (ref 26.0–34.0)
MCHC: 34.2 g/dL (ref 30.0–36.0)
MCV: 92.3 fL (ref 78.0–100.0)
Platelets: 301 10*3/uL (ref 150–400)
RBC: 3.64 MIL/uL — AB (ref 3.87–5.11)
RDW: 13 % (ref 11.5–15.5)
WBC: 17.6 10*3/uL — AB (ref 4.0–10.5)

## 2016-03-28 LAB — PROTEIN / CREATININE RATIO, URINE
Creatinine, Urine: 139 mg/dL
PROTEIN CREATININE RATIO: 0.37 mg/mg{creat} — AB (ref 0.00–0.15)
Total Protein, Urine: 52 mg/dL

## 2016-03-28 NOTE — MAU Note (Signed)
C/o headache Sat night and last night; BP elevated in OB's office;

## 2016-03-28 NOTE — MAU Provider Note (Signed)
MAU HISTORY AND PHYSICAL  Chief Complaint:  Hypertension  Steffanie DunnKimberly A Laumann is a 20 y.o.  G1P0 with IUP at 3342w3d presenting for elevated BP. BP 144/101 in clinic about 1400 today. She reports mild headache when she wake up in the morning for the last three days. Currently she does not have a headache. Denies vision changes or RUQ.   Patient states she has been having  none contractions, none vaginal bleeding, intact membranes, with active fetal movement.  Smokes 2-3 a day. No EtOH or drug use.   Past Medical History  Diagnosis Date  . Anxiety   . Depression     Past Surgical History  Procedure Laterality Date  . No past surgeries      Family History  Problem Relation Age of Onset  . Heart disease Maternal Grandfather     Social History  Substance Use Topics  . Smoking status: Current Every Day Smoker -- 0.25 packs/day  . Smokeless tobacco: Never Used  . Alcohol Use: No    No Known Allergies  Prescriptions prior to admission  Medication Sig Dispense Refill Last Dose  . acetaminophen (TYLENOL) 325 MG tablet Take 325 mg by mouth every 6 (six) hours as needed for headache.   03/27/2016 at Unknown time  . prenatal vitamin w/FE, FA (NATACHEW) 29-1 MG CHEW chewable tablet Chew 1 tablet by mouth daily at 12 noon.    03/28/2016 at Unknown time  . nitrofurantoin, macrocrystal-monohydrate, (MACROBID) 100 MG capsule Take 1 capsule (100 mg total) by mouth 2 (two) times daily. (Patient not taking: Reported on 03/28/2016) 10 capsule 0 Completed Course at Unknown time    Review of Systems - Negative except for what is mentioned in HPI.  Physical Exam  Blood pressure 141/98, pulse 106, temperature 98.1 F (36.7 C), temperature source Oral, resp. rate 18. GENERAL: Well-developed, well-nourished female in no acute distress.  LUNGS: Clear to auscultation bilaterally.  HEART: Regular rate and rhythm. ABDOMEN: Soft, nontender, nondistended, gravid.  EXTREMITIES: Nontender, no edema, 2+  distal pulses. Reflexes: 2+ patellar and biceps reflex FHT:  150/reactive Contractions: none   Labs: Results for orders placed or performed during the hospital encounter of 03/28/16 (from the past 24 hour(s))  Comprehensive metabolic panel   Collection Time: 03/28/16  5:03 PM  Result Value Ref Range   Sodium 137 135 - 145 mmol/L   Potassium 3.6 3.5 - 5.1 mmol/L   Chloride 105 101 - 111 mmol/L   CO2 22 22 - 32 mmol/L   Glucose, Bld 97 65 - 99 mg/dL   BUN 10 6 - 20 mg/dL   Creatinine, Ser 1.610.61 0.44 - 1.00 mg/dL   Calcium 8.8 (L) 8.9 - 10.3 mg/dL   Total Protein 7.0 6.5 - 8.1 g/dL   Albumin 2.9 (L) 3.5 - 5.0 g/dL   AST 16 15 - 41 U/L   ALT 13 (L) 14 - 54 U/L   Alkaline Phosphatase 143 (H) 38 - 126 U/L   Total Bilirubin 0.2 (L) 0.3 - 1.2 mg/dL   GFR calc non Af Amer >60 >60 mL/min   GFR calc Af Amer >60 >60 mL/min   Anion gap 10 5 - 15  CBC   Collection Time: 03/28/16  5:03 PM  Result Value Ref Range   WBC 17.6 (H) 4.0 - 10.5 K/uL   RBC 3.64 (L) 3.87 - 5.11 MIL/uL   Hemoglobin 11.5 (L) 12.0 - 15.0 g/dL   HCT 09.633.6 (L) 04.536.0 - 40.946.0 %   MCV  92.3 78.0 - 100.0 fL   MCH 31.6 26.0 - 34.0 pg   MCHC 34.2 30.0 - 36.0 g/dL   RDW 16.1 09.6 - 04.5 %   Platelets 301 150 - 400 K/uL  Protein / creatinine ratio, urine   Collection Time: 03/28/16  5:15 PM  Result Value Ref Range   Creatinine, Urine 139.00 mg/dL   Total Protein, Urine 52 mg/dL   Protein Creatinine Ratio 0.37 (H) 0.00 - 0.15 mg/mg[Cre]   Imaging Studies:  No results found.  Assessment: KAYLEEN ALIG is  20 y.o. G1P0 at [redacted]w[redacted]d seen for mild pre-eclampsia. BP in mild range. No severe features. UPC 0.37. LFT and platelet within normal. FWB reassuring with reactive heart tone.   Plan: Discharge home Pecautions including but not limited to headache, vision changes, RUQ pain, vaginal bleeding, contractions, leaking of fluid and fetal movement were reviewed. Handout was provided as in AVS Patient to return to clinic on  03/31/2016 for BP check and NST. Message was sent to clinic to arrange this follow up.  Almon Hercules 6/19/20176:07 PM  OB fellow attestation: I have seen and examined this patient; I agree with above documentation in the resident's note.   ALLISON DESHOTELS is a 21 y.o. G1P0 reporting being seen the office with elevated pressures and HA. She reported morning HA for the last 3 days not associated with vision changes. She reports decreased sleep/insomnia due to increased urination in the last several weeks and this complaint has been worse for the corresponding 3 days when she had a headache.  She took her BP at home during a HA and reports her BP was "high". She has tried tylenol but this does not make her HA go away completely and it will go away on its own. She does not have a headache currently.  She reports no history of migraines. Denies RUQ pain, SOB, Edema.  +FM, denies LOF, VB, contractions, vaginal discharge.  PE: BP 144/96 mmHg  Pulse 96  Temp(Src) 98.1 F (36.7 C) (Oral)  Resp 18 Gen: calm comfortable, NAD Resp: normal effort, no distress Abd: gravid. No RUQ pain.  Reflexes: 2+ on biceps, no clonus Ext: no edema in hand or legs  ROS, labs, PMH reviewed NST reactive   Plan: Preeclampsia without severe features: Diagnosed by BP > 6 hours apart > 140/90 with UPC=0.37. All BP are mild range today in MAU and not symptomatic, including that her headache is completely resolved at this point.  - Discussed preeclampsia s/sx in detail - Discussed increased water intake and eating frequently to help distinguish HA. Also recommended taking tylenol.  - Start 2x weekly NST with labs prn depending on symptoms - Patient will need IOL at 37 weeks and this was discussed with the patient in detail.  I also reviewed that she might need to be delivered earlier depending on how her symptoms progress over time.   FWB - called patient at 42 (see telephone message) - patient will get BMZ at  office and voiced understanding about the importance of following up.   Federico Flake, MD , MPH, ABFM Family Medicine, OB Fellow [facility]

## 2016-03-28 NOTE — Progress Notes (Signed)
Subjective:  Cynthia DunnKimberly A Aoun is a 20 y.o. G1P0 at 7857w3d being seen today for ongoing prenatal care.  She is currently monitored for the following issues for this low-risk pregnancy and has Supervision of normal first pregnancy, antepartum; Chlamydia infection affecting pregnancy in first trimester, antepartum; and UTI (urinary tract infection) in pregnancy in first trimester on her problem list.  Patient reports severe headache for 3 days..  Contractions: Irritability. Vag. Bleeding: None.  Movement: Present. Denies leaking of fluid.   The following portions of the patient's history were reviewed and updated as appropriate: allergies, current medications, past family history, past medical history, past social history, past surgical history and problem list. Problem list updated.  Objective:   Filed Vitals:   03/28/16 1412 03/28/16 1444  BP: 144/101 147/99  Pulse: 89 87  Weight: 162 lb (73.483 kg)     Fetal Status: Fetal Heart Rate (bpm): 143   Movement: Present     General:  Alert, oriented and cooperative. Patient is in no acute distress.  Skin: Skin is warm and dry. No rash noted.   Cardiovascular: Normal heart rate noted  Respiratory: Normal respiratory effort, no problems with respiration noted  Abdomen: Soft, gravid, appropriate for gestational age. Pain/Pressure: Absent     Pelvic: Cervical exam deferred        Extremities: Normal range of motion.  Edema: Trace  Mental Status: Normal mood and affect. Normal behavior. Normal judgment and thought content.   Urinalysis: Urine Protein: 1+ Urine Glucose: Negative  Assessment and Plan:  Pregnancy: G1P0 at 5357w3d  Gestational hypertension and possible preeclampsia.   Pt to go to MAU for evaluation.  Pt encouraged to go straight to hosptial.   No visual distrubances, no RUQ pain.  +hyperreflexia and headache. Should receive betamethasone.    Preterm labor symptoms and general obstetric precautions including but not limited to  vaginal bleeding, contractions, leaking of fluid and fetal movement were reviewed in detail with the patient. Please refer to After Visit Summary for other counseling recommendations.  Return in about 1 week (around 04/04/2016).   Lesly DukesKelly H Leggett, MD

## 2016-03-28 NOTE — Progress Notes (Signed)
Patient states that she has had a headache for last two days that is worse in the evening. Patient has tried Tylenol with little relief. Armandina StammerJennifer Stehanie Ekstrom RN BSN

## 2016-03-28 NOTE — Discharge Instructions (Signed)
Come to the MAU (maternity admission unit) for 1) Strong contractions every 2-3 minutes for at least 1 hour that do no go away when you drink water or take a warm shower. These contractions will be so strong all you can do is breath through them 2) Vaginal bleeding- anything more than spotting 3) Loss of fluid like you broke your water 4) Decreased movement of your baby  Insomnia: Benadryl (alcohol free)  every 6 hours as needed Tylenol PM Unisom, no Gelcaps   Preeclampsia and Eclampsia Preeclampsia is a serious condition that develops only during pregnancy. It is also called toxemia of pregnancy. This condition causes high blood pressure along with other symptoms, such as swelling and headaches. These may develop as the condition gets worse. Preeclampsia may occur 20 weeks or later into your pregnancy.  Diagnosing and treating preeclampsia early is very important. If not treated early, it can cause serious problems for you and your baby. One problem it can lead to is eclampsia, which is a condition that causes muscle jerking or shaking (convulsions) in the mother. Delivering your baby is the best treatment for preeclampsia or eclampsia.  RISK FACTORS The cause of preeclampsia is not known. You may be more likely to develop preeclampsia if you have certain risk factors. These include:   Being pregnant for the first time.  Having preeclampsia in a past pregnancy.  Having a family history of preeclampsia.  Having high blood pressure.  Being pregnant with twins or triplets.  Being 44 or older.  Being African American.  Having kidney disease or diabetes.  Having medical conditions such as lupus or blood diseases.  Being very overweight (obese). SIGNS AND SYMPTOMS  The earliest signs of preeclampsia are:  High blood pressure.  Increased protein in your urine. Your health care provider will check for this at every prenatal visit. Other symptoms that can develop include:    Severe headaches.  Sudden weight gain.  Swelling of your hands, face, legs, and feet.  Feeling sick to your stomach (nauseous) and throwing up (vomiting).  Vision problems (blurred or double vision).  Numbness in your face, arms, legs, and feet.  Dizziness.  Slurred speech.  Sensitivity to bright lights.  Abdominal pain. DIAGNOSIS  There are no screening tests for preeclampsia. Your health care provider will ask you about symptoms and check for signs of preeclampsia during your prenatal visits. You may also have tests, including:  Urine testing.  Blood testing.  Checking your baby's heart rate.  Checking the health of your baby and your placenta using images created with sound waves (ultrasound). TREATMENT  You can work out the best treatment approach together with your health care provider. It is very important to keep all prenatal appointments. If you have an increased risk of preeclampsia, you may need more frequent prenatal exams.  Your health care provider may prescribe bed rest.  You may have to eat as little salt as possible.  You may need to take medicine to lower your blood pressure if the condition does not respond to more conservative measures.  You may need to stay in the hospital if your condition is severe. There, treatment will focus on controlling your blood pressure and fluid retention. You may also need to take medicine to prevent seizures.  If the condition gets worse, your baby may need to be delivered early to protect you and the baby. You may have your labor started with medicine (be induced), or you may have a cesarean delivery.  Preeclampsia usually goes away after the baby is born. HOME CARE INSTRUCTIONS   Only take over-the-counter or prescription medicines as directed by your health care provider.  Lie on your left side while resting. This keeps pressure off your baby.  Elevate your feet while resting.  Get regular exercise. Ask your  health care provider what type of exercise is safe for you.  Avoid caffeine and alcohol.  Do not smoke.  Drink 6-8 glasses of water every day.  Eat a balanced diet that is low in salt. Do not add salt to your food.  Avoid stressful situations as much as possible.  Get plenty of rest and sleep.  Keep all prenatal appointments and tests as scheduled. SEEK MEDICAL CARE IF:  You are gaining more weight than expected.  You have any headaches, abdominal pain, or nausea.  You are bruising more than usual.  You feel dizzy or light-headed. SEEK IMMEDIATE MEDICAL CARE IF:   You develop sudden or severe swelling anywhere in your body. This usually happens in the legs.  You gain 5 lb (2.3 kg) or more in a week.  You have a severe headache, dizziness, problems with your vision, or confusion.  You have severe abdominal pain.  You have lasting nausea or vomiting.  You have a seizure.  You have trouble moving any part of your body.  You develop numbness in your body.  You have trouble speaking.  You have any abnormal bleeding.  You develop a stiff neck.  You pass out. MAKE SURE YOU:   Understand these instructions.  Will watch your condition.  Will get help right away if you are not doing well or get worse.   This information is not intended to replace advice given to you by your health care provider. Make sure you discuss any questions you have with your health care provider.   Document Released: 09/23/2000 Document Revised: 10/01/2013 Document Reviewed: 07/19/2013 Elsevier Interactive Patient Education [employer]2016 Elsevier Inc.

## 2016-03-28 NOTE — Telephone Encounter (Signed)
Called patient to discuss that [facility] [place] [facility]. I also discussed the need to betamethasone injection 24 hours apart to help with lung maturity were she to deliver prior to 37 weeks. Additionally I reiterated preeclampsia signs and symptoms. Patient voiced understanding and will expect a call from [place] to scheduled NST and will expect to get BMZ.   Federico FlakeKimberly Niles Liahm Grivas, MD, MPH, ABFM Family Medicine, OB Fellow [facility]

## 2016-03-29 ENCOUNTER — Observation Stay (HOSPITAL_COMMUNITY): Payer: 59

## 2016-03-29 ENCOUNTER — Inpatient Hospital Stay (HOSPITAL_COMMUNITY)
Admission: AD | Admit: 2016-03-29 | Discharge: 2016-04-05 | DRG: 766 | Disposition: A | Discharge status: Home / Self Care | Payer: 59 | Source: Ambulatory Visit | Attending: Obstetrics & Gynecology | Admitting: Obstetrics & Gynecology

## 2016-03-29 ENCOUNTER — Encounter (HOSPITAL_COMMUNITY): Payer: Self-pay

## 2016-03-29 DIAGNOSIS — O1414 Severe pre-eclampsia complicating childbirth: Secondary | ICD-10-CM | POA: Diagnosis not present

## 2016-03-29 DIAGNOSIS — H538 Other visual disturbances: Secondary | ICD-10-CM | POA: Diagnosis not present

## 2016-03-29 DIAGNOSIS — O1410 Severe pre-eclampsia, unspecified trimester: Secondary | ICD-10-CM | POA: Diagnosis present

## 2016-03-29 DIAGNOSIS — Z3A00 Weeks of gestation of pregnancy not specified: Secondary | ICD-10-CM

## 2016-03-29 DIAGNOSIS — O99334 Smoking (tobacco) complicating childbirth: Secondary | ICD-10-CM | POA: Diagnosis present

## 2016-03-29 DIAGNOSIS — O151 Eclampsia in labor: Secondary | ICD-10-CM | POA: Diagnosis not present

## 2016-03-29 DIAGNOSIS — Z3A33 33 weeks gestation of pregnancy: Secondary | ICD-10-CM

## 2016-03-29 DIAGNOSIS — F17210 Nicotine dependence, cigarettes, uncomplicated: Secondary | ICD-10-CM | POA: Diagnosis present

## 2016-03-29 DIAGNOSIS — Z3A32 32 weeks gestation of pregnancy: Secondary | ICD-10-CM | POA: Diagnosis not present

## 2016-03-29 DIAGNOSIS — O1413 Severe pre-eclampsia, third trimester: Secondary | ICD-10-CM | POA: Diagnosis not present

## 2016-03-29 DIAGNOSIS — O1493 Unspecified pre-eclampsia, third trimester: Secondary | ICD-10-CM

## 2016-03-29 LAB — COMPREHENSIVE METABOLIC PANEL
ALK PHOS: 151 U/L — AB (ref 38–126)
ALT: 13 U/L — AB (ref 14–54)
AST: 15 U/L (ref 15–41)
Albumin: 2.9 g/dL — ABNORMAL LOW (ref 3.5–5.0)
Anion gap: 9 (ref 5–15)
BUN: 12 mg/dL (ref 6–20)
CO2: 22 mmol/L (ref 22–32)
Calcium: 9.3 mg/dL (ref 8.9–10.3)
Chloride: 105 mmol/L (ref 101–111)
Creatinine, Ser: 0.61 mg/dL (ref 0.44–1.00)
GFR calc non Af Amer: >60 mL/min (ref >60)
Glucose, Bld: 91 mg/dL (ref 65–99)
Potassium: 3.9 mmol/L (ref 3.5–5.1)
SODIUM: 136 mmol/L (ref 135–145)
Total Bilirubin: 0.5 mg/dL (ref 0.3–1.2)
Total Protein: 6.7 g/dL (ref 6.5–8.1)

## 2016-03-29 LAB — URINE MICROSCOPIC-ADD ON

## 2016-03-29 LAB — CBC
HEMATOCRIT: 33.1 % — AB (ref 36.0–46.0)
HEMOGLOBIN: 11.5 g/dL — AB (ref 12.0–15.0)
MCH: 31.7 pg (ref 26.0–34.0)
MCHC: 34.7 g/dL (ref 30.0–36.0)
MCV: 91.2 fL (ref 78.0–100.0)
Platelets: 276 10*3/uL (ref 150–400)
RBC: 3.63 MIL/uL — AB (ref 3.87–5.11)
RDW: 13.1 % (ref 11.5–15.5)
WBC: 16.8 10*3/uL — AB (ref 4.0–10.5)

## 2016-03-29 LAB — URINALYSIS, ROUTINE W REFLEX MICROSCOPIC
BILIRUBIN URINE: NEGATIVE
Glucose, UA: NEGATIVE mg/dL
Hgb urine dipstick: NEGATIVE
Ketones, ur: NEGATIVE mg/dL
NITRITE: NEGATIVE
Protein, ur: NEGATIVE mg/dL
pH: 6.5 (ref 5.0–8.0)

## 2016-03-29 LAB — TYPE AND SCREEN
ABO/RH(D): O POS
Antibody Screen: NEGATIVE

## 2016-03-29 LAB — RPR: RPR: NONREACTIVE

## 2016-03-29 LAB — ABO/RH: ABO/RH(D): O POS

## 2016-03-29 MED ORDER — BETAMETHASONE SOD PHOS & ACET 6 (3-3) MG/ML IJ SUSP
12.0000 mg | INTRAMUSCULAR | Status: AC
Start: 2016-03-30 — End: 2016-03-30
  Administered 2016-03-30: 12 mg via INTRAMUSCULAR
  Filled 2016-03-29: qty 2

## 2016-03-29 MED ORDER — LABETALOL HCL 5 MG/ML IV SOLN
20.0000 mg | INTRAVENOUS | Status: AC | PRN
  Administered 2016-03-29: 40 mg via INTRAVENOUS
  Administered 2016-03-29 – 2016-03-31 (×2): 20 mg via INTRAVENOUS
  Filled 2016-03-29: qty 4
  Filled 2016-03-29: qty 8
  Filled 2016-03-29: qty 4

## 2016-03-29 MED ORDER — LACTATED RINGERS IV SOLN
INTRAVENOUS | Status: DC
  Administered 2016-03-29: 08:00:00 via INTRAVENOUS
  Administered 2016-03-29: 100 mL/h via INTRAVENOUS
  Administered 2016-03-30 – 2016-04-02 (×3): via INTRAVENOUS

## 2016-03-29 MED ORDER — COMPLETENATE 29-1 MG PO CHEW
1.0000 | CHEWABLE_TABLET | Freq: Every day | ORAL | Status: DC
  Administered 2016-03-29 – 2016-04-01 (×4): 1 via ORAL
  Filled 2016-03-29 (×6): qty 1

## 2016-03-29 MED ORDER — ACETAMINOPHEN 325 MG PO TABS: 325.0000 mg | ORAL_TABLET | Freq: Four times a day (QID) | ORAL | Status: DC | PRN

## 2016-03-29 MED ORDER — ZOLPIDEM TARTRATE 5 MG PO TABS: 5.0000 mg | ORAL_TABLET | Freq: Every evening | ORAL | Status: DC | PRN

## 2016-03-29 MED ORDER — PRENATAL MULTIVITAMIN CH: 1.0000 | ORAL_TABLET | Freq: Every day | ORAL | Status: DC

## 2016-03-29 MED ORDER — LACTATED RINGERS IV SOLN
INTRAVENOUS | Status: DC
  Administered 2016-03-29: 01:00:00 via INTRAVENOUS

## 2016-03-29 MED ORDER — BETAMETHASONE SOD PHOS & ACET 6 (3-3) MG/ML IJ SUSP
12.0000 mg | Freq: Once | INTRAMUSCULAR | Status: AC
Start: 2016-03-29 — End: 2016-03-29
  Administered 2016-03-29: 12 mg via INTRAMUSCULAR
  Filled 2016-03-29: qty 2

## 2016-03-29 MED ORDER — CALCIUM CARBONATE ANTACID 500 MG PO CHEW: 2.0000 | CHEWABLE_TABLET | ORAL | Status: DC | PRN

## 2016-03-29 MED ORDER — DOCUSATE SODIUM 100 MG PO CAPS
100.0000 mg | ORAL_CAPSULE | Freq: Every day | ORAL | Status: DC
  Administered 2016-03-29 – 2016-04-01 (×4): 100 mg via ORAL
  Filled 2016-03-29 (×4): qty 1

## 2016-03-29 MED ORDER — LABETALOL HCL 100 MG PO TABS
200.0000 mg | ORAL_TABLET | Freq: Three times a day (TID) | ORAL | Status: DC
  Administered 2016-03-29 – 2016-03-30 (×7): 200 mg via ORAL
  Filled 2016-03-29 (×7): qty 2

## 2016-03-29 MED ORDER — ACETAMINOPHEN 325 MG PO TABS: 650.0000 mg | ORAL_TABLET | ORAL | Status: DC | PRN

## 2016-03-29 MED ORDER — HYDRALAZINE HCL 20 MG/ML IJ SOLN
10.0000 mg | Freq: Once | INTRAMUSCULAR | Status: AC | PRN
  Administered 2016-03-31: 10 mg via INTRAVENOUS
  Filled 2016-03-29: qty 1

## 2016-03-29 NOTE — H&P (Signed)
FACULTY PRACTICE ANTEPARTUM ADMISSION HISTORY AND PHYSICAL NOTE   History of Present Illness: Cynthia Carroll is a 20 y.o. G1P0 at 521w4d admitted for management of severe range blood pressure/preeclamspia  Pt was seen in the MAU yesterday after c/o HA for 3 days with elevated BP's.  Labs revealed an elevated pr:Cr ratio but, resolutio of HA and BP NOT in the severe range.  Pt was asked to repeat her BP' at home.  She present due to elevated BP's at home for reeval.  She denies HA, visual changes or RUQ pain.   Patient reports the fetal movement as active. Patient reports uterine contraction  activity as none. Patient reports  vaginal bleeding as none. Patient describes fluid per vagina as None. Fetal presentation is unsure.  Patient Active Problem List   Diagnosis Date Noted  . Preeclampsia, severe 03/29/2016  . Mild preeclampsia 03/28/2016  . Chlamydia infection affecting pregnancy in first trimester, antepartum 10/26/2015  . UTI (urinary tract infection) in pregnancy in first trimester 10/26/2015  . Supervision of normal first pregnancy, antepartum 10/12/2015    Past Medical History  Diagnosis Date  . Anxiety   . Depression     Past Surgical History  Procedure Laterality Date  . No past surgeries      OB History  Gravida Para Term Preterm AB SAB TAB Ectopic Multiple Living  1             # Outcome Date GA Lbr Len/2nd Weight Sex Delivery Anes PTL Lv  1 Current               Social History   Social History  . Marital Status: Single    Spouse Name: N/A  . Number of Children: N/A  . Years of Education: N/A   Social History Main Topics  . Smoking status: Current Every Day Smoker -- 0.25 packs/day  . Smokeless tobacco: Never Used  . Alcohol Use: No  . Drug Use: No  . Sexual Activity:    Partners: Male    Birth Control/ Protection: Injection, None   Other Topics Concern  . None   Social History Narrative    Family History  Problem Relation Age of  Onset  . Heart disease Maternal Grandfather     No Known Allergies  Prescriptions prior to admission  Medication Sig Dispense Refill Last Dose  . acetaminophen (TYLENOL) 325 MG tablet Take 325 mg by mouth every 6 (six) hours as needed for headache.   03/27/2016 at Unknown time  . prenatal vitamin w/FE, FA (NATACHEW) 29-1 MG CHEW chewable tablet Chew 1 tablet by mouth daily at 12 noon.    03/28/2016 at Unknown time    Review of Systems - pt had HA reported for the past 3 days which resolved spontaneously.  She still denies HA.  Vitals:  BP 146/98 mmHg  Pulse 85  Ht 5\' 4"  (1.626 m)  Wt 162 lb (73.483 kg)  BMI 27.79 kg/m2 Physical Examination: GENERAL: Well-developed, well-nourished female in no acute distress.  ABDOMEN: Soft, nontender, nondistended. No organomegaly. Gravid EXTREMITIES: No cyanosis, clubbing, or edema, 2+ distal pulses with DTRs 1+ bilaterally CERVIX: Not evaluated.  Membranes:intact Fetal Monitoring:Baseline: 140's bpm, Variability: Good {> 6 bpm), Accelerations: Non-reactive but appropriate for gestational age and Decelerations: Absent Tocometer: Flat  Labs:  Results for orders placed or performed during the hospital encounter of 03/29/16 (from the past 24 hour(s))  Urinalysis, Routine w reflex microscopic ([facility])   Collection Time: 03/29/16  12:35 AM  Result Value Ref Range   Color, Urine YELLOW YELLOW   APPearance CLEAR CLEAR   Specific Gravity, Urine <1.005 (L) 1.005 - 1.030   pH 6.5 5.0 - 8.0   Glucose, UA NEGATIVE NEGATIVE mg/dL   Hgb urine dipstick NEGATIVE NEGATIVE   Bilirubin Urine NEGATIVE NEGATIVE   Ketones, ur NEGATIVE NEGATIVE mg/dL   Protein, ur NEGATIVE NEGATIVE mg/dL   Nitrite NEGATIVE NEGATIVE   Leukocytes, UA SMALL (A) NEGATIVE  Urine microscopic-add on   Collection Time: 03/29/16 12:35 AM  Result Value Ref Range   Squamous Epithelial / LPF 0-5 (A) NONE SEEN   WBC, UA 0-5 0 - 5 WBC/hpf   RBC / HPF 0-5 0 - 5 RBC/hpf   Bacteria, UA  RARE (A) NONE SEEN  Comprehensive metabolic panel   Collection Time: 03/29/16  1:10 AM  Result Value Ref Range   Sodium 136 135 - 145 mmol/L   Potassium 3.9 3.5 - 5.1 mmol/L   Chloride 105 101 - 111 mmol/L   CO2 22 22 - 32 mmol/L   Glucose, Bld 91 65 - 99 mg/dL   BUN 12 6 - 20 mg/dL   Creatinine, Ser 1.61 0.44 - 1.00 mg/dL   Calcium 9.3 8.9 - 09.6 mg/dL   Total Protein 6.7 6.5 - 8.1 g/dL   Albumin 2.9 (L) 3.5 - 5.0 g/dL   AST 15 15 - 41 U/L   ALT 13 (L) 14 - 54 U/L   Alkaline Phosphatase 151 (H) 38 - 126 U/L   Total Bilirubin 0.5 0.3 - 1.2 mg/dL   GFR calc non Af Amer >60 >60 mL/min   GFR calc Af Amer >60 >60 mL/min   Anion gap 9 5 - 15  CBC   Collection Time: 03/29/16  1:10 AM  Result Value Ref Range   WBC 16.8 (H) 4.0 - 10.5 K/uL   RBC 3.63 (L) 3.87 - 5.11 MIL/uL   Hemoglobin 11.5 (L) 12.0 - 15.0 g/dL   HCT 04.5 (L) 40.9 - 81.1 %   MCV 91.2 78.0 - 100.0 fL   MCH 31.7 26.0 - 34.0 pg   MCHC 34.7 30.0 - 36.0 g/dL   RDW 91.4 78.2 - 95.6 %   Platelets 276 150 - 400 K/uL  Type and screen [facility]   Collection Time: 03/29/16  1:10 AM  Result Value Ref Range   ABO/RH(D) O POS    Antibody Screen NEG    Sample Expiration 04/01/2016   Results for orders placed or performed during the hospital encounter of 03/28/16 (from the past 24 hour(s))  Comprehensive metabolic panel   Collection Time: 03/28/16  5:03 PM  Result Value Ref Range   Sodium 137 135 - 145 mmol/L   Potassium 3.6 3.5 - 5.1 mmol/L   Chloride 105 101 - 111 mmol/L   CO2 22 22 - 32 mmol/L   Glucose, Bld 97 65 - 99 mg/dL   BUN 10 6 - 20 mg/dL   Creatinine, Ser 2.13 0.44 - 1.00 mg/dL   Calcium 8.8 (L) 8.9 - 10.3 mg/dL   Total Protein 7.0 6.5 - 8.1 g/dL   Albumin 2.9 (L) 3.5 - 5.0 g/dL   AST 16 15 - 41 U/L   ALT 13 (L) 14 - 54 U/L   Alkaline Phosphatase 143 (H) 38 - 126 U/L   Total Bilirubin 0.2 (L) 0.3 - 1.2 mg/dL   GFR calc non Af Amer >60 >60 mL/min   GFR calc  Af Amer >60 >60 mL/min    Anion gap 10 5 - 15  CBC   Collection Time: 03/28/16  5:03 PM  Result Value Ref Range   WBC 17.6 (H) 4.0 - 10.5 K/uL   RBC 3.64 (L) 3.87 - 5.11 MIL/uL   Hemoglobin 11.5 (L) 12.0 - 15.0 g/dL   HCT 16.1 (L) 09.6 - 04.5 %   MCV 92.3 78.0 - 100.0 fL   MCH 31.6 26.0 - 34.0 pg   MCHC 34.2 30.0 - 36.0 g/dL   RDW 40.9 81.1 - 91.4 %   Platelets 301 150 - 400 K/uL  Protein / creatinine ratio, urine   Collection Time: 03/28/16  5:15 PM  Result Value Ref Range   Creatinine, Urine 139.00 mg/dL   Total Protein, Urine 52 mg/dL   Protein Creatinine Ratio 0.37 (H) 0.00 - 0.15 mg/mg[Cre]    Imaging Studies: No results found.   Assessment and Plan: Patient Active Problem List   Diagnosis Date Noted  . Preeclampsia, severe 03/29/2016  . Mild preeclampsia 03/28/2016  . Chlamydia infection affecting pregnancy in first trimester, antepartum 10/26/2015  . UTI (urinary tract infection) in pregnancy in first trimester 10/26/2015  . Supervision of normal first pregnancy, antepartum 10/12/2015   Admit to Antenatal Betamethasone x 2 doses Magnesium sulfate for CP prophylaxis if delivery becomes imminent Fetal US later today to assess growth.  If IUGR noted- UA dopplers Begin Labetalol 200mg  bid   NICU consult 24 hour urine for protein Continuous fetal monitoring for now Routine antenatal care  Pt has been notified that the NICU is full and that should she need to deliver imminently she will delivery here but, her baby may need to be transferred to another facility.  She was also notified that should it be determined that she needs to deliver while she is stable, she may be transferred while fetus is in utero.  Pt stable at present. Will recheck presentation if she does progress in preterm labor to determine route of delivery   Holly Pring L. Harraway-Smith, M.D., [facility]

## 2016-03-29 NOTE — Consult Note (Signed)
[facility]  Prenatal Consult       03/29/2016  7:04 PM   I was asked by Dr. Erin FullingHarraway Smith to consult on this patient for possible preterm delivery.  I had the pleasure of meeting with Ms. Cynthia Carroll today.  She is a 20 y/o G1P0 at 7032 and 4/[redacted] weeks gestation who was admitted for pre-eclampsia with severe features.  She has been started on labetalol and received the first dose of betamethasone today and will receive the 2nd dose tomorrow.  She is also receiving magnesium for neuroprotection.  Per [name], she had an ultrasound today that showed a growth restricted fetus with oligohydramnios, but the final ultrasound report is not available.  The fetus is a female.  She will remain in the hospital until she delivers.    I explained that the neonatal intensive care team would be present for the delivery and outlined the likely delivery room course for this baby including routine resuscitation and NRP-guided approaches to the treatment of respiratory distress. We discussed other common problems associated with prematurity including respiratory distress syndrome/CLD, apnea, feeding issues, temperature regulation, and infection risk.  We briefly discussed IVH/PVL, ROP, and NEC and that these are complications associated with prematurity, but that by 30 weeks are uncommon.    We discussed the average length of stay but I noted that the actual LOS would depend on the severity of problems encountered and response to treatments.  We discussed visitation policies and the resources available while her child is in the hospital.  We discussed the importance of good nutrition and various methods of providing nutrition (parenteral hyperalimentation, gavage feedings and/or oral feeding). We discussed the benefits of human milk. I encouraged breast feeding and pumping soon after birth and outlined resources that are available to support breast feeding.  She does intent to provide breast milk and we  discussed the possibility of using donor breast milk to supplement depending on the size of her infant.    Thank you for involving us in the care of this patient. A member of our team will be available should the family have additional questions.  Time for consultation approximately 45 minutes.   _____________________ Electronically Signed By: Maryan CharLindsey Daila Elbert, MD Neonatologist

## 2016-03-29 NOTE — MAU Note (Signed)
Pt presents stating she took her BP at home and it was 160/102. States she was told by provider today to check before bed and come in if elevated. Denies HA, visual changes, or epigastric pain. Reports good fetal movement. Denies leaking or bleeding.

## 2016-03-29 NOTE — H&P (Signed)
ANTEPARTUM HISTORY AND PHYSICAL NOTE  Cynthia Carroll is a 20 y.o. female G1P0 with IUP at 4431w4d by LMP presenting for elevated BP at home.  Prenatal risk factors: nullipara, active smoker (1-3 cigarettes daily), chlamydia in 1st trimester. Pt states that over the weekend had frontal headache that did not improve with tylenol but that has since resolved.  Yesterday was seen in MAU due to elevated BP and bloodwork revealed elevated UPC 0.37 with remaining CBC, CMP wnl, diagnosed with mild pre-eclampsia.  Pt was instructed to take BP at home and [facility] or [facility] if elevated.  Today she presents b/c of BP  160/110 at home.  Upon arrival BP 177/106, peaked at 183/110. Pt denies any HA, changes in vision, CP, SOB, RUQ pain, swelling in extremities or face.  This is the first pregnancy for FOB.   She reports positive fetal movement. She denies leakage of fluid or vaginal bleeding.  Prenatal History/Complications:  Past Medical History: Past Medical History  Diagnosis Date  . Anxiety   . Depression     Past Surgical History: Past Surgical History  Procedure Laterality Date  . No past surgeries      Obstetrical History: OB History    Gravida Para Term Preterm AB TAB SAB Ectopic Multiple Living   1               Social History: Social History   Social History  . Marital Status: Single    Spouse Name: N/A  . Number of Children: N/A  . Years of Education: N/A   Social History Main Topics  . Smoking status: Current Every Day Smoker -- 0.25 packs/day  . Smokeless tobacco: Never Used  . Alcohol Use: No  . Drug Use: No  . Sexual Activity:    Partners: Male    Birth Control/ Protection: Injection, None   Other Topics Concern  . None   Social History Narrative    Family History: Family History  Problem Relation Age of Onset  . Heart disease Maternal Grandfather     Allergies: No Known Allergies  Prescriptions prior to admission  Medication Sig  Dispense Refill Last Dose  . acetaminophen (TYLENOL) 325 MG tablet Take 325 mg by mouth every 6 (six) hours as needed for headache.   03/27/2016 at Unknown time  . prenatal vitamin w/FE, FA (NATACHEW) 29-1 MG CHEW chewable tablet Chew 1 tablet by mouth daily at 12 noon.    03/28/2016 at Unknown time     Review of Systems   All systems reviewed and negative except as stated in HPI  Blood pressure 169/108, pulse 84. General appearance: alert and cooperative Lungs: clear to auscultation bilaterally Heart: regular rate and rhythm Abdomen: soft, non-tender; bowel sounds normal Extremities: No calf swelling or tenderness Presentation: cephalic Fetal monitoring: Baseline 150, mod variability, +acels, no decels Uterine activity: No contractions     Prenatal labs: ABO, Rh: O/POS/-- (01/02 1001) Antibody: NEG (01/02 1001) Rubella: Immune RPR: NON REAC (05/22 1600)  HBsAg: NEGATIVE (01/02 1001)  HIV: NONREACTIVE (05/22 1600)  GBS:   pending 1 hr Glucola: 98 Genetic screening:  Declined Anatomy US: 02/02/2016-[facility] [redacted]W[redacted]D, anterior placenta, AFI subjectively wnl, EFW 613g (1lb 5oz) 35%tile, normal anatomy, female  Prenatal Transfer Tool  Maternal Diabetes: No Genetic Screening: Declined Maternal Ultrasounds/Referrals: Normal Fetal Ultrasounds or other Referrals:  None Maternal Substance Abuse:  Yes:  Type: Smoker Significant Maternal Medications:  None Significant Maternal Lab Results: None  Results for  orders placed or performed during the hospital encounter of 03/28/16 (from the past 24 hour(s))  Comprehensive metabolic panel   Collection Time: 03/28/16  5:03 PM  Result Value Ref Range   Sodium 137 135 - 145 mmol/L   Potassium 3.6 3.5 - 5.1 mmol/L   Chloride 105 101 - 111 mmol/L   CO2 22 22 - 32 mmol/L   Glucose, Bld 97 65 - 99 mg/dL   BUN 10 6 - 20 mg/dL   Creatinine, Ser 9.56 0.44 - 1.00 mg/dL   Calcium 8.8 (L) 8.9 - 10.3 mg/dL   Total Protein 7.0 6.5 - 8.1 g/dL   Albumin 2.9  (L) 3.5 - 5.0 g/dL   AST 16 15 - 41 U/L   ALT 13 (L) 14 - 54 U/L   Alkaline Phosphatase 143 (H) 38 - 126 U/L   Total Bilirubin 0.2 (L) 0.3 - 1.2 mg/dL   GFR calc non Af Amer >60 >60 mL/min   GFR calc Af Amer >60 >60 mL/min   Anion gap 10 5 - 15  CBC   Collection Time: 03/28/16  5:03 PM  Result Value Ref Range   WBC 17.6 (H) 4.0 - 10.5 K/uL   RBC 3.64 (L) 3.87 - 5.11 MIL/uL   Hemoglobin 11.5 (L) 12.0 - 15.0 g/dL   HCT 21.3 (L) 08.6 - 57.8 %   MCV 92.3 78.0 - 100.0 fL   MCH 31.6 26.0 - 34.0 pg   MCHC 34.2 30.0 - 36.0 g/dL   RDW 46.9 62.9 - 52.8 %   Platelets 301 150 - 400 K/uL  Protein / creatinine ratio, urine   Collection Time: 03/28/16  5:15 PM  Result Value Ref Range   Creatinine, Urine 139.00 mg/dL   Total Protein, Urine 52 mg/dL   Protein Creatinine Ratio 0.37 (H) 0.00 - 0.15 mg/mg[Cre]    Patient Active Problem List   Diagnosis Date Noted  . Mild preeclampsia 03/28/2016  . Chlamydia infection affecting pregnancy in first trimester, antepartum 10/26/2015  . UTI (urinary tract infection) in pregnancy in first trimester 10/26/2015  . Supervision of normal first pregnancy, antepartum 10/12/2015    Assessment: Cynthia Carroll is a 20 y.o. G1P0 at [redacted]w[redacted]d here for pre-eclampsia with severe features (BP 183/110, UPC 0.37)  #Severe pre-eclampsia -Start labetalol -No repeat PIH labs since had then drawn 6/19 in PM -Give 1st dose betamethasone now -Admit for obs and ensure control of BP  Olena Leatherwood 03/29/2016, 12:51 AM

## 2016-03-29 NOTE — Progress Notes (Signed)
Patient ID: Cynthia DunnKimberly A Valentino, female   DOB: 07/01/1996, 20 y.o.   MRN: [id number] Pt asleep.  Did not awaken.  BP's improving but, still with some severe range BP's.  Continue current care as per H&P.  Ashlye Oviedo L. Harraway-Smith, M.D., [facility]ACOG

## 2016-03-30 DIAGNOSIS — O1413 Severe pre-eclampsia, third trimester: Secondary | ICD-10-CM | POA: Diagnosis not present

## 2016-03-30 DIAGNOSIS — Z3A32 32 weeks gestation of pregnancy: Secondary | ICD-10-CM | POA: Diagnosis not present

## 2016-03-30 LAB — CREATININE, URINE, 24 HOUR
CREATININE 24H UR: 1113 mg/d (ref 600–1800)
Collection Interval-UCRE24: 24 hours
Creatinine, Urine: 38.39 mg/dL
URINE TOTAL VOLUME-UCRE24: 2900 mL

## 2016-03-30 LAB — PROTEIN, URINE, 24 HOUR
Collection Interval-UPROT: 24 hours
Protein, 24H Urine: 348 mg/d — ABNORMAL HIGH (ref 50–100)
Protein, Urine: 12 mg/dL
Urine Total Volume-UPROT: 2900 mL

## 2016-03-30 LAB — CREATININE CLEARANCE, URINE, 24 HOUR
CREAT CLEAR: 124 mL/min — AB (ref 75–115)
CREATININE 24H UR: 1092 mg/d (ref 600–1800)
Collection Interval-CRCL: 24 hours
Creatinine, Urine: 37.64 mg/dL
URINE TOTAL VOLUME-CRCL: 2900 mL

## 2016-03-30 LAB — CULTURE, BETA STREP (GROUP B ONLY)

## 2016-03-30 MED ORDER — LACTATED RINGERS IV BOLUS (SEPSIS)
1000.0000 mL | Freq: Once | INTRAVENOUS | Status: AC
  Administered 2016-03-30: 1000 mL via INTRAVENOUS

## 2016-03-30 MED ORDER — LACTATED RINGERS IV BOLUS (SEPSIS)
500.0000 mL | Freq: Once | INTRAVENOUS | Status: AC
  Administered 2016-03-30: 500 mL via INTRAVENOUS

## 2016-03-30 NOTE — Progress Notes (Signed)
Patient ID: Cynthia Carroll, female   DOB: 01-29-1996, 20 y.o.   MRN: [id number] FACULTY PRACTICE ANTEPARTUM(COMPREHENSIVE) NOTE  Cynthia Carroll is a 20 y.o. G1P0 at 6186w5d  who is admitted for preeclampsia.    Fetal presentation is cephalic. Length of Stay:    Days  Date of admission:03/29/2016  Subjective: Patient reports feeling well. She denies headaches, visual disturbances, RUQ/epigastric pain, nausea/emesis Patient reports the fetal movement as active. Patient reports uterine contraction  activity as none. Patient reports  vaginal bleeding as none. Patient describes fluid per vagina as None.  Vitals:  Blood pressure 131/86, pulse 86, temperature 98.6 F (37 C), temperature source Oral, resp. rate 16, height 5\' 4"  (1.626 m), weight 162 lb (73.483 kg), SpO2 100 %. Filed Vitals:   03/29/16 2200 03/30/16 0150 03/30/16 0756 03/30/16 0929  BP:  147/93 147/93 131/86  Pulse:  84 86 86  Temp:  98.2 F (36.8 C) 98.6 F (37 C)   TempSrc:  Oral Oral   Resp: 14 14 16    Height:      Weight:      SpO2:   100%    Physical Examination:  General appearance - alert, well appearing, and in no distress Abdomen: soft, gravid, NT Cervical Exam: Not evaluated.  Extremities: extremities normal, atraumatic, no cyanosis or edema with DTRs 2+ bilaterally Membranes:intact  Fetal Monitoring:  Baseline: 130 bpm, Variability: Good {> 6 bpm), Accelerations: Reactive and Decelerations: occasional 2 minute variable decels overnight    Toco: no contractions  Labs:  Results for orders placed or performed during the hospital encounter of 03/29/16 (from the past 24 hour(s))  Creatinine clearance, urine, 24 hour   Collection Time: 03/30/16  2:25 AM  Result Value Ref Range   Urine Total Volume-CRCL 2900 mL   Collection Interval-CRCL 24 hours   Creatinine, Urine 37.64 mg/dL   Creatinine, 40J24H Ur 81191092 600 - 1800 mg/day   Creatinine Clearance 124 (H) 75 - 115 mL/min  Protein, urine, 24 hour   Collection Time: 03/30/16  2:25 AM  Result Value Ref Range   Urine Total Volume-UPROT 2900 mL   Collection Interval-UPROT 24 hours   Protein, Urine 12 mg/dL   Protein, 14N24H Urine 829348 (H) 50 - 100 mg/day    Imaging Studies:      Medications:  Scheduled . docusate sodium  100 mg Oral Daily  . labetalol  200 mg Oral TID  . prenatal vitamin w/FE, FA  1 tablet Oral Q1200   I have reviewed the patient's current medications.  ASSESSMENT: G1P0 10786w5d Estimated Date of Delivery: 05/20/16  Patient Active Problem List   Diagnosis Date Noted  . Preeclampsia, severe 03/29/2016  . Mild preeclampsia 03/28/2016  . Chlamydia infection affecting pregnancy in first trimester, antepartum 10/26/2015  . UTI (urinary tract infection) in pregnancy in first trimester 10/26/2015  . Supervision of normal first pregnancy, antepartum 10/12/2015    PLAN: 19 yp G1P0 at 6714w5d with preeclampsia s/p BMZ - Continue labetalol  - Continue monitoring BP - Continue inpatient observation - Deliver for worsening preeclampsia or fetal indications  Rowin Bayron 03/30/2016,10:13 AM

## 2016-03-30 NOTE — Progress Notes (Signed)
Patient off monitor at this time to take a shower

## 2016-03-30 NOTE — Progress Notes (Signed)
Fetal monitoring continued at this time.

## 2016-03-30 NOTE — Progress Notes (Signed)
Spoke with Dr Jolayne Pantheronstant about fetal tracing, she gave orders for 500ml bolus.

## 2016-03-31 DIAGNOSIS — O1413 Severe pre-eclampsia, third trimester: Secondary | ICD-10-CM | POA: Diagnosis not present

## 2016-03-31 DIAGNOSIS — Z3A32 32 weeks gestation of pregnancy: Secondary | ICD-10-CM

## 2016-03-31 MED ORDER — HYDRALAZINE HCL 20 MG/ML IJ SOLN
10.0000 mg | Freq: Once | INTRAMUSCULAR | Status: AC | PRN
  Administered 2016-03-31: 10 mg via INTRAVENOUS
  Filled 2016-03-31: qty 1

## 2016-03-31 MED ORDER — LABETALOL HCL 300 MG PO TABS
300.0000 mg | ORAL_TABLET | Freq: Three times a day (TID) | ORAL | Status: DC
  Administered 2016-03-31 – 2016-04-03 (×9): 300 mg via ORAL
  Filled 2016-03-31 (×9): qty 1

## 2016-03-31 NOTE — Clinical SW OB High Risk (Signed)
Clinical Social Work Antenatal   Clinical Social Worker:  Marshell Garfinkel Date/Time:  03/31/2016, 3:12 PM Gestational Age on Admission:  20 y.o. Admitting Diagnosis:     Expected Delivery Date:  04/18/16 (hopeful to get to 34 weeks)  Family/Home Environment  Home Address:  [facility] Member/Support Name:  Patient lives with a family friend since December 2016.  Boyfriend and friend live in home with MOB Relationship:  Significant Other, Brother, Mother, Father Other Support:  Parents, grandparents, friends   Psychosocial Data  Information Source:  Patient Interview Resources:  None at this time. Patient is interested in housing options.  At this time, does not qualify for any prenatal housing, but discussed getting on wait list for HUD or public housing.  Discussed apartment finding and other community resources such as [place], medicaid.   Employment:  Works from home for a company in [facility]):  [place]:  [place]   Current Grade:  Completed high school  Homebound Arranged: No  Other Resources:  [employer], [place]:  None noted at this time.   Strengths/Weaknesses/Factors to Consider  Concerns Related to Hospitalization:  The main concern with patient is her housing situation. She is not in an unsafe situation or problems at home, but she and boyfriend live with a friend who expects them to pay all the bills which puts a lot of pressure on patient and boyfriend.  She is wanting her own home and working with her mother to find a tailor in [place].   Patient also disclosed BF does not have consistent transportation as he does not have his driver's license.     Previous Pregnancies/Feelings Towards Pregnancy?  Concerns related to being/becoming a mother?:  NO previous pregnancy or babies.  Patient reports this was an unexpected pregnancy per report. She and  boyfriend just got together in August and she quickly got pregnant.  She reports she has decided to keep her baby as she made the decision to have sex she needs to responsible.  She reports her mother has been supportive, but her father has just recently come around to accept the pregnancy. Patient reports she is excited for the baby to come and has all she needs for baby.    Social Support (FOB? Who is/will be helping with baby/other kids?): Main supports are FOB and her mother and father.  She siblings are younger and FOB's family is not involved as she reports his upbringing was inconsistent. There is limited information that FOB has shared with MOB regarding his family situation, but what is known is his mother has "problems" and he has lacked contact with her for years.  His father is not involved at all and FOB grew up homeless, sleeping on friends couches, and be-friended patient's brother.  MOB reports FOB's siblings are in custody with his 38 and Grandmother whom she first met at her baby shower. MOB reports her father is starting to accept her pregnancy, but in the beginning she was not allowed at his house and her mother takes care of her younger brother.    Couples Relationship (describe): As stated above, patient and FOB are a fairly new couple. Per reports they got together in August of 2016 and quickly found out in Dec 2016 she was pregnant.  Both MOB and FOB have full time jobs and completed high school.  They are currently living together and she denies any  safety or relationship problems at this time.  Reports FOB is younger than MOB.   Recent Stressful Life Events (life changes in past year?):  Overall the recent unplanned pregnancy, new housing with a friend, and relationship strain on her parents and self when letting them know she was pregnant. No recent traumas or crisis known. Reports being in hospital currently has been stressful.   Prenatal Care/Education/Home Preparations:  Patient did get adequate PNC throughout pregnancy.  She completed high school and currently working full time in home.  She plans to keep looking for an apartment or alternative living situation that will be more ideal for self, baby and FOB.  She plans to return to current housing if nothing can be found between now and DC. No safety concerns in home at this time.     Domestic Violence (of any type):  No If Yes to Domestic Violence, Describe/Action Plan:  NA   Substance Use During Pregnancy: No (If Yes, Complete SBIRT)  Complete PHQ-9 (Depresssion Screening) on all Antenatal Patients PHQ-9 Score (If Score => 15 complete TREAT):  NA   Follow-up Recommendations:  LCSW has given resources for [facility] program, [email or web address] which is an Therapist, nutritional, and call to see if patient would qualify for an DSS benefit diverson program while she is in the hospital. LCSW will refer to Parents as Teachers in [facility] for additional support for prenatal.   Patient Advised/Response:   Agreeable to resources and agreeable for LCSW to follow while patient on Antenatal Unit and once baby is delivered to assess if she and baby qualify for any additional services   Other:  NA   Clinical Assessment/Plan:   Will continue to follow and assist with emotional needs while inpatient and after delivery along with any resources that MOB and baby may qualify for ad DC.  Lane Hacker, MSW Clinical Social Work: [employer] [phone]

## 2016-03-31 NOTE — Progress Notes (Signed)
Patient sitting up eating at this time. Fetal monitoring is not tracing at this time.

## 2016-03-31 NOTE — Progress Notes (Signed)
Patient ID: Cynthia DunnKimberly A Carstarphen, female   DOB: 1996-05-22, 22 y.o.   MRN: [id number] Patient ID: Cynthia DunnKimberly A Jabbour, female   DOB: 1996-05-22, 20 y.o.   MRN: [id number] FACULTY PRACTICE ANTEPARTUM(COMPREHENSIVE) NOTE  Cynthia Carroll is a 20 y.o. G1P0 at 9260w6d   who is admitted for preeclampsia.    Fetal presentation is cephalic. Length of Stay:    Days  Date of admission:03/29/2016  Subjective: Had episode this am of BP spike, required 3 doses IV anti hypertensive, changed dosing interval Patient reports feeling well. She denies headaches, visual disturbances, RUQ/epigastric pain, nausea/emesis Patient reports the fetal movement as active. Patient reports uterine contraction  activity as none. Patient reports  vaginal bleeding as none. Patient describes fluid per vagina as None.  Vitals:  Blood pressure 130/73, pulse 81, temperature 98.4 F (36.9 C), temperature source Oral, resp. rate 18, height 5\' 4"  (1.626 m), weight 163 lb 5.8 oz (74.1 kg), SpO2 93 %. Filed Vitals:   03/31/16 0500 03/31/16 0747 03/31/16 0801 03/31/16 0901  BP: 130/68 139/76 140/75 130/73  Pulse: 91 89 86 81  Temp:  98.4 F (36.9 C)    TempSrc:  Oral    Resp: 18 18    Height:      Weight:      SpO2: 99% 93%     Physical Examination:  General appearance - alert, well appearing, and in no distress Abdomen: soft, gravid, NT Cervical Exam: Not evaluated.  Extremities: extremities normal, atraumatic, no cyanosis or edema with DTRs 2+ bilaterally Membranes:intact  Fetal Monitoring:  Baseline: 130 bpm, Variability: Good {> 6 bpm), Accelerations: Reactive and Decelerations: occasional 2 minute variable decels overnight    Toco: no contractions  Labs:  No results found for this or any previous visit (from the past 24 hour(s)).  Imaging Studies:      Medications:  Scheduled . docusate sodium  100 mg Oral Daily  . labetalol  300 mg Oral Q8H  . prenatal vitamin w/FE, FA  1 tablet Oral Q1200   I have  reviewed the patient's current medications.  ASSESSMENT: G1P0 4832w5d Estimated Date of Delivery: 05/20/16  Patient Active Problem List   Diagnosis Date Noted  . Preeclampsia, severe 03/29/2016  . Mild preeclampsia 03/28/2016  . Chlamydia infection affecting pregnancy in first trimester, antepartum 10/26/2015  . UTI (urinary tract infection) in pregnancy in first trimester 10/26/2015  . Supervision of normal first pregnancy, antepartum 10/12/2015    PLAN: 19 yp G1P0 at 2960w6d  with preeclampsia s/p BMZ - Continue labetalol increase to 300 TID every 8 hours - Continue monitoring BP - Continue inpatient observation - Deliver for worsening preeclampsia or fetal indications  Pt understands trying to make it to 34 weeks but will deliver based on clinical course  Tricia Pledger H 03/31/2016,9:10 AM

## 2016-04-01 DIAGNOSIS — H538 Other visual disturbances: Secondary | ICD-10-CM | POA: Diagnosis not present

## 2016-04-01 DIAGNOSIS — Z3A00 Weeks of gestation of pregnancy not specified: Secondary | ICD-10-CM | POA: Diagnosis not present

## 2016-04-01 DIAGNOSIS — O99334 Smoking (tobacco) complicating childbirth: Secondary | ICD-10-CM | POA: Diagnosis present

## 2016-04-01 DIAGNOSIS — F17210 Nicotine dependence, cigarettes, uncomplicated: Secondary | ICD-10-CM | POA: Diagnosis present

## 2016-04-01 DIAGNOSIS — Z3A33 33 weeks gestation of pregnancy: Secondary | ICD-10-CM | POA: Diagnosis not present

## 2016-04-01 DIAGNOSIS — O1414 Severe pre-eclampsia complicating childbirth: Secondary | ICD-10-CM | POA: Diagnosis present

## 2016-04-01 DIAGNOSIS — O151 Eclampsia in labor: Secondary | ICD-10-CM | POA: Diagnosis not present

## 2016-04-01 LAB — TYPE AND SCREEN
ABO/RH(D): O POS
ANTIBODY SCREEN: NEGATIVE

## 2016-04-01 NOTE — Progress Notes (Signed)
FACULTY PRACTICE ANTEPARTUM(COMPREHENSIVE) NOTE  Steffanie DunnKimberly A Scollard is a 20 y.o. G1P0 at 6617w0d by early ultrasound who is admitted for preeclampsia.   Fetal presentation is cephalic. Length of Stay:    Days  Subjective:no headache or visual changes  Patient reports the fetal movement as active. Patient reports uterine contraction  activity as none. Patient reports  vaginal bleeding as none. Patient describes fluid per vagina as None.  Vitals:  Blood pressure 155/92, pulse 92, temperature 98.1 F (36.7 C), temperature source Oral, resp. rate 18, height 5\' 4"  (1.626 m), weight 74.1 kg (163 lb 5.8 oz), SpO2 94 %. Physical Examination:  General appearance - alert, well appearing, and in no distress Heart - normal rate and regular rhythm Abdomen - soft, nontender, nondistended Fundal Height:  size equals dates Cervical Exam: Not evaluated. Extremities: extremities normal, atraumatic, no cyanosis or edema and Homans sign is negative, no sign of DVT with DTRs 2+ bilaterally Membranes:intact  Fetal Monitoring:     Fetal Heart Rate A      Mode  External filed at 04/01/2016 0700    Baseline Rate (A)  145 bpm filed at 04/01/2016 0700    Variability  <5 BPM, 6-25 BPM filed at 04/01/2016 0700    Accelerations  None filed at 04/01/2016 0700    Decelerations  Variable filed at 04/01/2016 0700    Multiple birth?  N filed at 03/31/2016 1216      Labs:  Results for orders placed or performed during the hospital encounter of 03/29/16 (from the past 24 hour(s))  Type and screen [facility]   Collection Time: 04/01/16  2:52 AM  Result Value Ref Range   ABO/RH(D) O POS    Antibody Screen NEG    Sample Expiration 04/04/2016       Medications:  Scheduled . docusate sodium  100 mg Oral Daily  . labetalol  300 mg Oral Q8H  . prenatal vitamin w/FE, FA  1 tablet Oral Q1200   I have reviewed the patient's current medications.  ASSESSMENT: Patient Active  Problem List   Diagnosis Date Noted  . Preeclampsia, severe 03/29/2016  . Mild preeclampsia 03/28/2016  . Chlamydia infection affecting pregnancy in first trimester, antepartum 10/26/2015  . UTI (urinary tract infection) in pregnancy in first trimester 10/26/2015  . Supervision of normal first pregnancy, antepartum 10/12/2015    PLAN: Observe until 34 weeks unless worsening preeclampsia or for fetal indications.  Continuous monitoring  ARNOLD,JAMES 04/01/2016,7:47 AM

## 2016-04-02 ENCOUNTER — Encounter (HOSPITAL_COMMUNITY)
Admission: AD | Disposition: A | Discharge status: Home / Self Care | Payer: Self-pay | Source: Ambulatory Visit | Attending: Obstetrics & Gynecology

## 2016-04-02 ENCOUNTER — Inpatient Hospital Stay (HOSPITAL_COMMUNITY): Payer: 59 | Admitting: Certified Registered Nurse Anesthetist

## 2016-04-02 ENCOUNTER — Inpatient Hospital Stay (HOSPITAL_COMMUNITY): Payer: 59

## 2016-04-02 ENCOUNTER — Encounter (HOSPITAL_COMMUNITY): Payer: Self-pay | Admitting: *Deleted

## 2016-04-02 DIAGNOSIS — O1414 Severe pre-eclampsia complicating childbirth: Secondary | ICD-10-CM

## 2016-04-02 DIAGNOSIS — O151 Eclampsia in labor: Secondary | ICD-10-CM | POA: Diagnosis not present

## 2016-04-02 DIAGNOSIS — Z3A33 33 weeks gestation of pregnancy: Secondary | ICD-10-CM

## 2016-04-02 LAB — COMPREHENSIVE METABOLIC PANEL
ALK PHOS: 111 U/L (ref 38–126)
ALT: 13 U/L — ABNORMAL LOW (ref 14–54)
ANION GAP: 7 (ref 5–15)
AST: 19 U/L (ref 15–41)
Albumin: 2.5 g/dL — ABNORMAL LOW (ref 3.5–5.0)
BUN: 17 mg/dL (ref 6–20)
CALCIUM: 8.4 mg/dL — AB (ref 8.9–10.3)
CO2: 21 mmol/L — AB (ref 22–32)
Chloride: 107 mmol/L (ref 101–111)
Creatinine, Ser: 0.7 mg/dL (ref 0.44–1.00)
Glucose, Bld: 78 mg/dL (ref 65–99)
Potassium: 3.8 mmol/L (ref 3.5–5.1)
SODIUM: 135 mmol/L (ref 135–145)
TOTAL PROTEIN: 6.1 g/dL — AB (ref 6.5–8.1)
Total Bilirubin: 0.4 mg/dL (ref 0.3–1.2)

## 2016-04-02 LAB — CBC WITH DIFFERENTIAL/PLATELET
Basophils Absolute: 0 10*3/uL (ref 0.0–0.1)
Basophils Relative: 0 %
Eosinophils Absolute: 0 10*3/uL (ref 0.0–0.7)
Eosinophils Relative: 0 %
HCT: 31.6 % — ABNORMAL LOW (ref 36.0–46.0)
HEMOGLOBIN: 11 g/dL — AB (ref 12.0–15.0)
Lymphocytes Relative: 17 %
Lymphs Abs: 2.4 10*3/uL (ref 0.7–4.0)
MCH: 31.8 pg (ref 26.0–34.0)
MCHC: 34.8 g/dL (ref 30.0–36.0)
MCV: 91.3 fL (ref 78.0–100.0)
Monocytes Absolute: 0.6 10*3/uL (ref 0.1–1.0)
Monocytes Relative: 4 %
Neutro Abs: 11.2 10*3/uL — ABNORMAL HIGH (ref 1.7–7.7)
Neutrophils Relative %: 79 %
Platelets: 256 10*3/uL (ref 150–400)
RBC: 3.46 MIL/uL — ABNORMAL LOW (ref 3.87–5.11)
RDW: 13.6 % (ref 11.5–15.5)
WBC: 14.3 10*3/uL — AB (ref 4.0–10.5)

## 2016-04-02 LAB — CBC
HEMATOCRIT: 30.4 % — AB (ref 36.0–46.0)
HEMOGLOBIN: 10.4 g/dL — AB (ref 12.0–15.0)
MCH: 31.4 pg (ref 26.0–34.0)
MCHC: 34.2 g/dL (ref 30.0–36.0)
MCV: 91.8 fL (ref 78.0–100.0)
Platelets: 246 10*3/uL (ref 150–400)
RBC: 3.31 MIL/uL — AB (ref 3.87–5.11)
RDW: 13.5 % (ref 11.5–15.5)
WBC: 19.9 10*3/uL — ABNORMAL HIGH (ref 4.0–10.5)

## 2016-04-02 SURGERY — Surgical Case
Anesthesia: Spinal

## 2016-04-02 MED ORDER — SIMETHICONE 80 MG PO CHEW
80.0000 mg | CHEWABLE_TABLET | Freq: Three times a day (TID) | ORAL | Status: DC
  Administered 2016-04-02 – 2016-04-05 (×9): 80 mg via ORAL
  Filled 2016-04-02 (×10): qty 1

## 2016-04-02 MED ORDER — CEFAZOLIN SODIUM-DEXTROSE 2-4 GM/100ML-% IV SOLN
2.0000 g | INTRAVENOUS | Status: AC
  Administered 2016-04-02: 2 g via INTRAVENOUS
  Filled 2016-04-02: qty 100

## 2016-04-02 MED ORDER — FENTANYL CITRATE (PF) 100 MCG/2ML IJ SOLN
INTRAMUSCULAR | Status: AC
  Filled 2016-04-02: qty 2

## 2016-04-02 MED ORDER — MORPHINE SULFATE (PF) 0.5 MG/ML IJ SOLN
INTRAMUSCULAR | Status: AC
  Filled 2016-04-02: qty 10

## 2016-04-02 MED ORDER — MAGNESIUM SULFATE BOLUS VIA INFUSION
4.0000 g | Freq: Once | INTRAVENOUS | Status: AC
  Administered 2016-04-02: 4 g via INTRAVENOUS
  Filled 2016-04-02: qty 500

## 2016-04-02 MED ORDER — PHENYLEPHRINE 8 MG IN D5W 100 ML (0.08MG/ML) PREMIX OPTIME
INJECTION | INTRAVENOUS | Status: DC | PRN
  Administered 2016-04-02: 40 ug/min via INTRAVENOUS

## 2016-04-02 MED ORDER — OXYTOCIN 40 UNITS IN LACTATED RINGERS INFUSION - SIMPLE MED: 2.5000 [IU]/h | INTRAVENOUS | Status: AC

## 2016-04-02 MED ORDER — SOD CITRATE-CITRIC ACID 500-334 MG/5ML PO SOLN
ORAL | Status: AC
  Filled 2016-04-02: qty 15

## 2016-04-02 MED ORDER — OXYTOCIN 10 UNIT/ML IJ SOLN
40.0000 [IU] | INTRAVENOUS | Status: DC | PRN
  Administered 2016-04-02: 40 [IU] via INTRAVENOUS

## 2016-04-02 MED ORDER — OXYCODONE HCL 5 MG PO TABS: 10.0000 mg | ORAL_TABLET | ORAL | Status: DC | PRN

## 2016-04-02 MED ORDER — MIDAZOLAM HCL 2 MG/2ML IJ SOLN
INTRAMUSCULAR | Status: AC
  Filled 2016-04-02: qty 2

## 2016-04-02 MED ORDER — NALBUPHINE HCL 10 MG/ML IJ SOLN: 5.0000 mg | INTRAMUSCULAR | Status: DC | PRN

## 2016-04-02 MED ORDER — SODIUM CHLORIDE 0.9 % IR SOLN
Status: DC | PRN
  Administered 2016-04-02: 1000 mL

## 2016-04-02 MED ORDER — MEPERIDINE HCL 25 MG/ML IJ SOLN: 6.2500 mg | INTRAMUSCULAR | Status: DC | PRN

## 2016-04-02 MED ORDER — LACTATED RINGERS IV SOLN
INTRAVENOUS | Status: DC | PRN
  Administered 2016-04-02: 12:00:00 via INTRAVENOUS

## 2016-04-02 MED ORDER — DIPHENHYDRAMINE HCL 25 MG PO CAPS: 25.0000 mg | ORAL_CAPSULE | Freq: Four times a day (QID) | ORAL | Status: DC | PRN

## 2016-04-02 MED ORDER — COCONUT OIL OIL: 1.0000 "application " | TOPICAL_OIL | Status: DC | PRN

## 2016-04-02 MED ORDER — MIDAZOLAM HCL 2 MG/2ML IJ SOLN
INTRAMUSCULAR | Status: DC | PRN
  Administered 2016-04-02: 1 mg via INTRAVENOUS
  Administered 2016-04-02 (×2): 2 mg via INTRAVENOUS
  Administered 2016-04-02: 1 mg via INTRAVENOUS

## 2016-04-02 MED ORDER — PRENATAL MULTIVITAMIN CH: 1.0000 | ORAL_TABLET | Freq: Every day | ORAL | Status: DC

## 2016-04-02 MED ORDER — LACTATED RINGERS IV SOLN
INTRAVENOUS | Status: DC | PRN
  Administered 2016-04-02: 11:00:00 via INTRAVENOUS

## 2016-04-02 MED ORDER — SCOPOLAMINE 1 MG/3DAYS TD PT72
1.0000 | MEDICATED_PATCH | Freq: Once | TRANSDERMAL | Status: DC
  Filled 2016-04-02: qty 1

## 2016-04-02 MED ORDER — NALBUPHINE HCL 10 MG/ML IJ SOLN: 5.0000 mg | Freq: Once | INTRAMUSCULAR | Status: DC | PRN

## 2016-04-02 MED ORDER — NALOXONE HCL 2 MG/2ML IJ SOSY
1.0000 ug/kg/h | PREFILLED_SYRINGE | INTRAVENOUS | Status: DC | PRN
  Filled 2016-04-02: qty 2

## 2016-04-02 MED ORDER — SENNOSIDES-DOCUSATE SODIUM 8.6-50 MG PO TABS
2.0000 | ORAL_TABLET | ORAL | Status: DC
  Administered 2016-04-03 – 2016-04-04 (×2): 2 via ORAL
  Filled 2016-04-02 (×2): qty 2

## 2016-04-02 MED ORDER — DIBUCAINE 1 % RE OINT: 1.0000 "application " | TOPICAL_OINTMENT | RECTAL | Status: DC | PRN

## 2016-04-02 MED ORDER — OXYTOCIN 10 UNIT/ML IJ SOLN
INTRAMUSCULAR | Status: AC
  Filled 2016-04-02: qty 4

## 2016-04-02 MED ORDER — ACETAMINOPHEN 325 MG PO TABS: 650.0000 mg | ORAL_TABLET | ORAL | Status: DC | PRN

## 2016-04-02 MED ORDER — FENTANYL CITRATE (PF) 100 MCG/2ML IJ SOLN
INTRAMUSCULAR | Status: DC | PRN
  Administered 2016-04-02: 25 ug via INTRATHECAL

## 2016-04-02 MED ORDER — MAGNESIUM SULFATE 50 % IJ SOLN
2.0000 g/h | INTRAVENOUS | Status: AC
  Administered 2016-04-02 – 2016-04-03 (×2): 2 g/h via INTRAVENOUS
  Filled 2016-04-02 (×2): qty 80

## 2016-04-02 MED ORDER — HYDROMORPHONE HCL 1 MG/ML IJ SOLN
INTRAMUSCULAR | Status: AC
  Administered 2016-04-02: 0.25 mg via INTRAVENOUS
  Filled 2016-04-02: qty 1

## 2016-04-02 MED ORDER — WITCH HAZEL-GLYCERIN EX PADS: 1.0000 "application " | MEDICATED_PAD | CUTANEOUS | Status: DC | PRN

## 2016-04-02 MED ORDER — TETANUS-DIPHTH-ACELL PERTUSSIS 5-2.5-18.5 LF-MCG/0.5 IM SUSP
0.5000 mL | Freq: Once | INTRAMUSCULAR | Status: DC
Start: 2016-04-03 — End: 2016-04-05
  Filled 2016-04-02: qty 0.5

## 2016-04-02 MED ORDER — ONDANSETRON HCL 4 MG/2ML IJ SOLN
INTRAMUSCULAR | Status: AC
Start: 2016-04-02 — End: 2016-04-02
  Filled 2016-04-02: qty 2

## 2016-04-02 MED ORDER — OXYCODONE HCL 5 MG PO TABS
5.0000 mg | ORAL_TABLET | ORAL | Status: DC | PRN
  Administered 2016-04-05: 5 mg via ORAL
  Filled 2016-04-02: qty 1

## 2016-04-02 MED ORDER — MAGNESIUM SULFATE 50 % IJ SOLN
2.0000 g/h | INTRAMUSCULAR | Status: DC
  Filled 2016-04-02: qty 80

## 2016-04-02 MED ORDER — MENTHOL 3 MG MT LOZG
1.0000 | LOZENGE | OROMUCOSAL | Status: DC | PRN
  Filled 2016-04-02: qty 9

## 2016-04-02 MED ORDER — SIMETHICONE 80 MG PO CHEW: 80.0000 mg | CHEWABLE_TABLET | ORAL | Status: DC | PRN

## 2016-04-02 MED ORDER — DIPHENHYDRAMINE HCL 25 MG PO CAPS: 25.0000 mg | ORAL_CAPSULE | ORAL | Status: DC | PRN

## 2016-04-02 MED ORDER — ONDANSETRON HCL 4 MG/2ML IJ SOLN: 4.0000 mg | Freq: Three times a day (TID) | INTRAMUSCULAR | Status: DC | PRN

## 2016-04-02 MED ORDER — BUTORPHANOL TARTRATE 1 MG/ML IJ SOLN
1.0000 mg | Freq: Once | INTRAMUSCULAR | Status: AC
  Administered 2016-04-02: 1 mg via INTRAVENOUS
  Filled 2016-04-02: qty 1

## 2016-04-02 MED ORDER — ONDANSETRON HCL 4 MG/2ML IJ SOLN
INTRAMUSCULAR | Status: DC | PRN
  Administered 2016-04-02: 4 mg via INTRAVENOUS

## 2016-04-02 MED ORDER — LACTATED RINGERS IV SOLN
INTRAVENOUS | Status: DC
  Administered 2016-04-02 – 2016-04-03 (×3): via INTRAVENOUS

## 2016-04-02 MED ORDER — IBUPROFEN 600 MG PO TABS
600.0000 mg | ORAL_TABLET | Freq: Four times a day (QID) | ORAL | Status: DC
  Administered 2016-04-02 – 2016-04-04 (×7): 600 mg via ORAL
  Filled 2016-04-02 (×7): qty 1

## 2016-04-02 MED ORDER — PHENYLEPHRINE 8 MG IN D5W 100 ML (0.08MG/ML) PREMIX OPTIME
INJECTION | INTRAVENOUS | Status: AC
  Filled 2016-04-02: qty 100

## 2016-04-02 MED ORDER — MORPHINE SULFATE (PF) 0.5 MG/ML IJ SOLN
INTRAMUSCULAR | Status: DC | PRN
  Administered 2016-04-02: .1 mg via EPIDURAL

## 2016-04-02 MED ORDER — DIPHENHYDRAMINE HCL 50 MG/ML IJ SOLN: 12.5000 mg | INTRAMUSCULAR | Status: DC | PRN

## 2016-04-02 MED ORDER — AMLODIPINE BESYLATE 5 MG PO TABS
5.0000 mg | ORAL_TABLET | Freq: Every day | ORAL | Status: DC
  Administered 2016-04-02 – 2016-04-03 (×2): 5 mg via ORAL
  Filled 2016-04-02 (×4): qty 1

## 2016-04-02 MED ORDER — LACTATED RINGERS IV BOLUS (SEPSIS)
500.0000 mL | Freq: Once | INTRAVENOUS | Status: AC
  Administered 2016-04-02: 400 mL via INTRAVENOUS
  Administered 2016-04-02: 500 mL via INTRAVENOUS

## 2016-04-02 MED ORDER — HYDRALAZINE HCL 20 MG/ML IJ SOLN
10.0000 mg | Freq: Once | INTRAMUSCULAR | Status: AC
  Administered 2016-04-02: 10 mg via INTRAVENOUS
  Filled 2016-04-02: qty 1

## 2016-04-02 MED ORDER — SIMETHICONE 80 MG PO CHEW
80.0000 mg | CHEWABLE_TABLET | ORAL | Status: DC
  Administered 2016-04-04: 80 mg via ORAL
  Filled 2016-04-02 (×2): qty 1

## 2016-04-02 MED ORDER — ACETAMINOPHEN 500 MG PO TABS
1000.0000 mg | ORAL_TABLET | Freq: Four times a day (QID) | ORAL | Status: AC
  Administered 2016-04-02 – 2016-04-03 (×4): 1000 mg via ORAL
  Filled 2016-04-02 (×4): qty 2

## 2016-04-02 MED ORDER — NALOXONE HCL 0.4 MG/ML IJ SOLN: 0.4000 mg | INTRAMUSCULAR | Status: DC | PRN

## 2016-04-02 MED ORDER — SODIUM CHLORIDE 0.9% FLUSH: 3.0000 mL | INTRAVENOUS | Status: DC | PRN

## 2016-04-02 MED ORDER — HYDROMORPHONE HCL 1 MG/ML IJ SOLN
0.2500 mg | INTRAMUSCULAR | Status: DC | PRN
  Administered 2016-04-02 (×2): 0.25 mg via INTRAVENOUS

## 2016-04-02 SURGICAL SUPPLY — 29 items
BRR ADH 6X5 SEPRAFILM 1 SHT (MISCELLANEOUS)
CLAMP CORD UMBIL (MISCELLANEOUS) IMPLANT
CONTAINER PREFILL 10% NBF 15ML (MISCELLANEOUS) IMPLANT
DRSG OPSITE POSTOP 4X10 (GAUZE/BANDAGES/DRESSINGS) ×2 IMPLANT
DURAPREP 26ML APPLICATOR (WOUND CARE) ×2 IMPLANT
ELECT REM PT RETURN 9FT ADLT (ELECTROSURGICAL) ×2
ELECTRODE REM PT RTRN 9FT ADLT (ELECTROSURGICAL) ×1 IMPLANT
EXTRACTOR VACUUM M CUP 4 TUBE (SUCTIONS) IMPLANT
GLOVE BIOGEL PI IND STRL 6.5 (GLOVE) ×1 IMPLANT
GLOVE BIOGEL PI IND STRL 7.0 (GLOVE) ×1 IMPLANT
GLOVE BIOGEL PI INDICATOR 6.5 (GLOVE) ×1
GLOVE BIOGEL PI INDICATOR 7.0 (GLOVE) ×1
GLOVE SURG SS PI 6.0 STRL IVOR (GLOVE) ×2 IMPLANT
GOWN STRL REUS W/TWL LRG LVL3 (GOWN DISPOSABLE) ×4 IMPLANT
KIT ABG SYR 3ML LUER SLIP (SYRINGE) IMPLANT
NDL HYPO 25X5/8 SAFETYGLIDE (NEEDLE) IMPLANT
NEEDLE HYPO 25X5/8 SAFETYGLIDE (NEEDLE) IMPLANT
NS IRRIG 1000ML POUR BTL (IV SOLUTION) ×2 IMPLANT
PACK C SECTION WH (CUSTOM PROCEDURE TRAY) ×2 IMPLANT
PAD OB MATERNITY 4.3X12.25 (PERSONAL CARE ITEMS) ×2 IMPLANT
PENCIL SMOKE EVAC W/HOLSTER (ELECTROSURGICAL) ×2 IMPLANT
RTRCTR C-SECT PINK 25CM LRG (MISCELLANEOUS) IMPLANT
SEPRAFILM MEMBRANE 5X6 (MISCELLANEOUS) IMPLANT
SPONGE GAUZE 4X4 16PLY NONSTR (GAUZE/BANDAGES/DRESSINGS) ×1 IMPLANT
SUT PLAIN 0 NONE (SUTURE) IMPLANT
SUT VIC AB 0 CT1 36 (SUTURE) ×8 IMPLANT
SUT VIC AB 4-0 KS 27 (SUTURE) ×2 IMPLANT
TOWEL OR 17X24 6PK STRL BLUE (TOWEL DISPOSABLE) ×2 IMPLANT
TRAY FOLEY CATH SILVER 14FR (SET/KITS/TRAYS/PACK) ×2 IMPLANT

## 2016-04-02 NOTE — Transfer of Care (Signed)
Immediate Anesthesia Transfer of Care Note  Patient: Cynthia Carroll  Procedure(s) Performed: Procedure(s): CESAREAN SECTION (N/A)  Patient Location: PACU  Anesthesia Type:Spinal  Level of Consciousness: awake and alert   Airway & Oxygen Therapy: Patient Spontanous Breathing  Post-op Assessment: Report given to RN and Post -op Vital signs reviewed and stable  Post vital signs: Reviewed and stable  Last Vitals:  Filed Vitals:   04/02/16 1105 04/02/16 1112  BP:  151/99  Pulse: 90 93  Temp:    Resp:      Last Pain:  Filed Vitals:   04/02/16 1118  PainSc: 0-No pain      Patients Stated Pain Goal: 2 (03/31/16 1951)  Complications: No apparent anesthesia complications

## 2016-04-02 NOTE — Anesthesia Preprocedure Evaluation (Signed)
Anesthesia Evaluation  Patient identified by MRN, date of birth, ID band Patient awake    Reviewed: Allergy & Precautions, H&P , Patient's Chart, lab work & pertinent test results  Airway Mallampati: II  TM Distance: >3 FB Neck ROM: full    Dental no notable dental hx.    Pulmonary Current Smoker,    Pulmonary exam normal breath sounds clear to auscultation       Cardiovascular Exercise Tolerance: Good  Rhythm:regular Rate:Normal     Neuro/Psych    GI/Hepatic   Endo/Other    Renal/GU      Musculoskeletal   Abdominal   Peds  Hematology   Anesthesia Other Findings   Reproductive/Obstetrics                             Anesthesia Physical Anesthesia Plan  ASA: II and emergent  Anesthesia Plan: Spinal   Post-op Pain Management:    Induction:   Airway Management Planned:   Additional Equipment:   Intra-op Plan:   Post-operative Plan:   Informed Consent: I have reviewed the patients History and Physical, chart, labs and discussed the procedure including the risks, benefits and alternatives for the proposed anesthesia with the patient or authorized representative who has indicated his/her understanding and acceptance.   Dental Advisory Given  Plan Discussed with: CRNA  Anesthesia Plan Comments: (Lab work confirmed with CRNA in room. Platelets okay. Discussed spinal anesthetic, and patient consents to the procedure:  included risk of possible headache,backache, failed block, allergic reaction, and nerve injury. This patient was asked if she had any questions or concerns before the procedure started. )        Anesthesia Quick Evaluation

## 2016-04-02 NOTE — Anesthesia Procedure Notes (Signed)

## 2016-04-02 NOTE — Progress Notes (Signed)
Patient ID: Cynthia Carroll, female   DOB: 03/17/1996, 20 y.o.   MRN: [id number] FACULTY PRACTICE ANTEPARTUM(COMPREHENSIVE) NOTE  Cynthia DunnKimberly A Carroll is a 20 y.o. G1P0 at 2087w1d by best clinical estimate who is admitted for pre-eclampsia.   Fetal presentation is cephalic. Length of Stay:  1  Days  Subjective: Denies headache, vision changes or RUQ pain. Patient reports the fetal movement as active. Patient reports uterine contraction  activity as none. Patient reports  vaginal bleeding as none. Patient describes fluid per vagina as None.  Vitals:  Blood pressure 151/95, pulse 86, temperature 98.2 F (36.8 C), temperature source Oral, resp. rate 20, height 5\' 4"  (1.626 m), weight 163 lb 5.8 oz (74.1 kg), SpO2 94 %. Physical Examination:  General appearance - alert, well appearing, and in no distress Chest - normal effort Abdomen - gravid, NT Fundal Height:  size equals dates Extremities: Homans sign is negative, no sign of DVT  Membranes:intact DTR's 2+ brisk  Fetal Monitoring:  Baseline: 140 bpm, Variability: Fair (1-6 bpm), Accelerations: Non-reactive but appropriate for gestational age and Decelerations: Variable: moderate   Medications:  Scheduled . docusate sodium  100 mg Oral Daily  . labetalol  300 mg Oral Q8H  . prenatal vitamin w/FE, FA  1 tablet Oral Q1200   I have reviewed the patient's current medications.  ASSESSMENT: Active Problems:   Preeclampsia, severe   PLAN: Fetal monitoring appears to have more frequent decels and less variability. BP's are creeping further up. Repeat labs today. Check BPP.  Reva BoresPRATT,Sami Froh S, MD 04/02/2016,6:43 AM

## 2016-04-02 NOTE — Anesthesia Postprocedure Evaluation (Signed)
Anesthesia Post Note  Patient: Cynthia Carroll  Procedure(s) Performed: Procedure(s) (LRB): CESAREAN SECTION (N/A)  Patient location during evaluation: PACU Anesthesia Type: General Level of consciousness: sedated Pain management: satisfactory to patient Vital Signs Assessment: post-procedure vital signs reviewed and stable Respiratory status: spontaneous breathing Cardiovascular status: stable Anesthetic complications: no Comments: Awake, oriented No rales in lung bases     Last Vitals:  Filed Vitals:   04/02/16 1112 04/02/16 1305  BP: 151/99 135/92  Pulse: 93 98  Temp:  36.8 C  Resp:  21    Last Pain:  Filed Vitals:   04/02/16 1349  PainSc: 0-No pain   Pain Goal: Patients Stated Pain Goal: 2 (03/31/16 1951)               Cynthia Carroll,Cynthia Carroll

## 2016-04-02 NOTE — Progress Notes (Signed)
Patient ID: Cynthia Carroll, female   DOB: April 08, 1996, 20 y.o.   MRN: [id number] Patient reports complete resolution of her headache. BPP 4/8 this morning. FHT: baseline 140 with min variability and spontaneous variable decels, no accels. No contractions Discussed delivery via primary cesarean section secondary to non reassuring fetal status. Risks, benefits and alternatives were reviewed including but not limited to risks of bleeding, infection and damage to adjacent organs. Patient verbalized understanding and all questions were answered. Consent signed. Patient has previously received betamethasone on 6/20-6/21. NICU currently has a high census and are aware of eminent delivery. Patient was made aware that her son may potentially be transferred to another facility if NICU is unable to accommodate him.

## 2016-04-02 NOTE — Op Note (Addendum)
Cynthia Carroll PROCEDURE DATE: 03/29/2016 - 04/02/2016  PREOPERATIVE DIAGNOSIS: Intrauterine pregnancy at  10010w1d weeks gestation; non-reassuring fetal status, severe preeclampsia  POSTOPERATIVE DIAGNOSIS: The same, eclampsia  PROCEDURE:     Cesarean Section  SURGEON:  Dr. Catalina AntiguaPeggy Simara Rhyner  ASSISTANT: none  INDICATIONS: Cynthia Carroll is a 20 y.o. G1P0 at 3110w1d scheduled for cesarean section secondary to non-reassuring fetal status. Patient admitted for inpatient management of severe preeclampsia with a BPP 4/10 this morning. The risks of cesarean section discussed with the patient included but were not limited to: bleeding which may require transfusion or reoperation; infection which may require antibiotics; injury to bowel, bladder, ureters or other surrounding organs; injury to the fetus; need for additional procedures including hysterectomy in the event of a life-threatening hemorrhage; placental abnormalities wth subsequent pregnancies, incisional problems, thromboembolic phenomenon and other postoperative/anesthesia complications. The patient concurred with the proposed plan, giving informed written consent for the procedure.    FINDINGS:  Viable female infant in cephalic presentation.  Apgars not available a time of note.  Clear amniotic fluid.  Cord gas was sent. Intact placenta, three vessel cord.  Normal uterus, fallopian tubes and ovaries bilaterally.  ANESTHESIA:    Spinal INTRAVENOUS FLUIDS:100 ml ESTIMATED BLOOD LOSS: 500 ml URINE OUTPUT:  150 ml SPECIMENS: Placenta sent to pathology COMPLICATIONS: None immediate  PROCEDURE IN DETAIL:  The patient received intravenous antibiotics and had sequential compression devices applied to her lower extremities while in the preoperative area.  She was then taken to the operating room where anesthesia was induced and was found to be adequate. A foley catheter was placed into her bladder and attached to Cynthia Carroll gravity. She was then  placed in a dorsal supine position with a leftward tilt, and prepped and draped in a sterile manner. After an adequate timeout was performed, a Pfannenstiel skin incision was made with scalpel and carried through to the underlying layer of fascia. The fascia was incised in the midline and this incision was extended bilaterally using the Mayo scissors. Kocher clamps were applied to the superior aspect of the fascial incision and the underlying rectus muscles were dissected off bluntly. A similar process was carried out on the inferior aspect of the facial incision. The rectus muscles were separated in the midline bluntly and the peritoneum was entered bluntly. The Alexis self-retaining retractor was introduced into the abdominal cavity. Patient developed an eclamptic seizure lasting approximately 2 minutes.  Attention was turned to the lower uterine segment where a transverse hysterotomy was made with a scalpel and extended bilaterally bluntly. The infant was successfully delivered, and cord was clamped and cut and infant was handed over to awaiting neonatology team. Uterine massage was then administered and the placenta delivered intact with three-vessel cord. The uterus was cleared of clot and debris.  The hysterotomy was closed with 0 Vicryl in a running locked fashion, and an imbricating layer was also placed with a 0 Vicryl. Overall, excellent hemostasis was noted. The pelvis copiously irrigated and cleared of all clot and debris. Hemostasis was confirmed on all surfaces.  The peritoneum and the muscles were reapproximated using 0 vicryl interrupted stitches. The fascia was then closed using 0 Vicryl in a running fashion.  The skin was closed in a subcuticular fashion using 3.0 Vicryl. The patient tolerated the procedure well. Sponge, lap, instrument and needle counts were correct x 2. She was taken to the recovery room in stable condition.    Cynthia Carroll,PEGGYMD  04/02/2016 12:57 PM

## 2016-04-03 LAB — COMPREHENSIVE METABOLIC PANEL
ALT: 11 U/L — AB (ref 14–54)
AST: 20 U/L (ref 15–41)
Albumin: 2.4 g/dL — ABNORMAL LOW (ref 3.5–5.0)
Alkaline Phosphatase: 97 U/L (ref 38–126)
Anion gap: 5 (ref 5–15)
BUN: 16 mg/dL (ref 6–20)
CHLORIDE: 103 mmol/L (ref 101–111)
CO2: 25 mmol/L (ref 22–32)
CREATININE: 0.7 mg/dL (ref 0.44–1.00)
Calcium: 7.3 mg/dL — ABNORMAL LOW (ref 8.9–10.3)
Glucose, Bld: 87 mg/dL (ref 65–99)
POTASSIUM: 3.9 mmol/L (ref 3.5–5.1)
SODIUM: 133 mmol/L — AB (ref 135–145)
Total Bilirubin: 0.6 mg/dL (ref 0.3–1.2)
Total Protein: 5.8 g/dL — ABNORMAL LOW (ref 6.5–8.1)

## 2016-04-03 LAB — CBC
HCT: 29.5 % — ABNORMAL LOW (ref 36.0–46.0)
Hemoglobin: 10 g/dL — ABNORMAL LOW (ref 12.0–15.0)
MCH: 31.2 pg (ref 26.0–34.0)
MCHC: 33.9 g/dL (ref 30.0–36.0)
MCV: 91.9 fL (ref 78.0–100.0)
PLATELETS: 215 10*3/uL (ref 150–400)
RBC: 3.21 MIL/uL — AB (ref 3.87–5.11)
RDW: 13.8 % (ref 11.5–15.5)
WBC: 16.7 10*3/uL — AB (ref 4.0–10.5)

## 2016-04-03 MED ORDER — COMPLETENATE 29-1 MG PO CHEW
1.0000 | CHEWABLE_TABLET | Freq: Every day | ORAL | Status: DC
  Administered 2016-04-03 – 2016-04-05 (×3): 1 via ORAL
  Filled 2016-04-03 (×3): qty 1

## 2016-04-03 NOTE — Progress Notes (Signed)
Subjective: Postpartum Day 1: Cesarean Delivery Patient reports feeling well. She denies HA, visual changes, RUQ/epigastric pain. She denies chest pain/SOB    Objective: Vital signs in last 24 hours: Temp:  [98 F (36.7 C)-99 F (37.2 C)] 98 F (36.7 C) (06/25 0615) Pulse Rate:  [71-112] 71 (06/25 0615) Resp:  [12-23] 20 (06/25 0615) BP: (114-171)/(63-106) 131/89 mmHg (06/25 0615) SpO2:  [88 %-100 %] 99 % (06/25 0615)  Intake/Output Summary (Last 24 hours) at 04/03/16 0736 Last data filed at 04/03/16 0703  Gross per 24 hour  Intake 3677.91 ml  Output   4525 ml  Net -847.09 ml     Physical Exam:  General: alert, cooperative and no distress Lochia: appropriate Uterine Fundus: firm Incision: Honeycomb dressing is intact and stained with dark blood centrally DVT Evaluation: No evidence of DVT seen on physical exam. Negative Homan's sign.   Recent Labs  04/02/16 1555 04/03/16 0547  HGB 10.4* 10.0*  HCT 30.4* 29.5*    Assessment/Plan: Status post Cesarean section secondary to severe preeclampsia with eclampsia Doing well postoperatively.  Continue magnesium sulfate for seizure prophylaxis. Continue monitoring BP   Joby Richart 04/03/2016, 7:35 AM

## 2016-04-03 NOTE — Progress Notes (Signed)
Pt ambulating well. PT assisted to NICU via w/c with her mother.

## 2016-04-03 NOTE — Addendum Note (Signed)
Addendum  created 04/03/16 0816 by Angela Adamana G Elisha Cooksey, CRNA   Modules edited: Clinical Notes   Clinical Notes:  File: [id number]

## 2016-04-03 NOTE — Anesthesia Postprocedure Evaluation (Signed)
Anesthesia Post Note  Patient: Cynthia Carroll  Procedure(s) Performed: Procedure(s) (LRB): CESAREAN SECTION (N/A)  Patient location during evaluation: Antenatal Level of consciousness: awake and alert, oriented and patient cooperative Pain management: pain level controlled Vital Signs Assessment: post-procedure vital signs reviewed and stable Respiratory status: spontaneous breathing Cardiovascular status: stable Postop Assessment: no headache, patient able to bend at knees, no signs of nausea or vomiting and spinal receding Anesthetic complications: no     Last Vitals:  Filed Vitals:   04/03/16 0746 04/03/16 0751  BP: 133/87   Pulse: 80 73  Temp: 36.8 C   Resp: 18     Last Pain:  Filed Vitals:   04/03/16 0754  PainSc: 0-No pain   Pain Goal: Patients Stated Pain Goal: 2 (04/02/16 2158)               [name],[name]

## 2016-04-03 NOTE — Lactation Note (Addendum)
This note was copied from a baby's chart. Lactation Consultation Note Assisted mother with hand expression and she was able to express about 3 ml.  Set-up with symphony and taught how to use the initiation phase and reviewed cleaning.  Explained need to pump at least 8 times in 24 hours and to add 4-5 sessions of hand expression. Reviewed Providing Breastmilk for Your Baby in NICU booklet with her. SHe has private Education officer, environmentalinsurance and medicaid, Encouraged mother to call insurance company tomorrow to inquire about pump benefits.  Follow-up planned. Patient Name: Cynthia Carroll GNFAO'[id number]'s Date: 04/03/2016 Reason for consult: Initial assessment;NICU baby   Maternal Data    Feeding Feeding Type: Formula Length of feed: 10 min  LATCH Score/Interventions                      Lactation Tools Discussed/Used     Consult Status Consult Status: Follow-up Date: 04/04/16 Follow-up type: In-patient    Cynthia Carroll, Cynthia Carroll 04/03/2016, 4:24 PM

## 2016-04-04 LAB — TYPE AND SCREEN
ABO/RH(D): O POS
ANTIBODY SCREEN: NEGATIVE

## 2016-04-04 MED ORDER — LABETALOL HCL 100 MG PO TABS
200.0000 mg | ORAL_TABLET | Freq: Two times a day (BID) | ORAL | Status: DC
  Administered 2016-04-04 (×2): 200 mg via ORAL
  Filled 2016-04-04 (×3): qty 2

## 2016-04-04 MED ORDER — SENNOSIDES-DOCUSATE SODIUM 8.6-50 MG PO TABS: 1.0000 | ORAL_TABLET | Freq: Every evening | ORAL | Status: DC | PRN

## 2016-04-04 MED ORDER — IBUPROFEN 600 MG PO TABS: 600.0000 mg | ORAL_TABLET | Freq: Four times a day (QID) | ORAL | Status: DC | PRN

## 2016-04-04 MED ORDER — LABETALOL HCL 100 MG PO TABS
200.0000 mg | ORAL_TABLET | Freq: Three times a day (TID) | ORAL | Status: DC
  Administered 2016-04-04 – 2016-04-05 (×2): 200 mg via ORAL
  Filled 2016-04-04 (×2): qty 2

## 2016-04-04 NOTE — Lactation Note (Signed)
This note was copied from a baby's chart. Lactation Consultation Note  Mom states she is obtaining a few mls with pumping.  Encouraged to continue pumping every 3 hours.  Discussed volume increasing between 3-5 days post partum.  Encouraged to call with concerns/assist.  Patient Name: Cynthia Carroll VHQIO'NToday's Date: 04/04/2016     Maternal Data    Feeding Feeding Type: Formula Length of feed: 10 min  LATCH Score/Interventions                      Lactation Tools Discussed/Used     Consult Status      Huston FoleyMOULDEN, Taeshawn Helfman S 04/04/2016, 1:42 PM

## 2016-04-04 NOTE — Progress Notes (Signed)
OB Note  Patient states left eye was fine this AM when I saw her initially but feels kinda blurry. She states her vision is fine when she has both eyes open but when she closes her right eye, her vision looks blurry. No pain, HA  Patient Vitals for the past 24 hrs:  BP Temp Temp src Pulse Resp  04/04/16 2016 (!) 155/99 mmHg 98.1 F (36.7 C) Oral 75 18  04/04/16 1851 (!) 145/81 mmHg - - 74 -  04/04/16 1805 (!) 166/109 mmHg - - 77 -  04/04/16 1727 (!) 166/101 mmHg 98.5 F (36.9 C) Oral 78 17  04/04/16 1220 130/85 mmHg 98.4 F (36.9 C) Oral 85 16  04/04/16 0751 (!) 150/96 mmHg 98.4 F (36.9 C) Oral 80 18  04/04/16 0608 (!) 141/95 mmHg - - 80 -  04/04/16 0031 (!) 125/91 mmHg - - 76 -   NAD PEERL EOMI Visual file testing seems normal bilaterally  A/P: left eye blurry vision -Since not worsening since it came on, can continue to follow and call optho in AM. Patient told sometimes eclampsia can cause visual issues that are usually self-resolving. Pt told to let us know if s/s -continue to follow BPs, with labetalol switch to 200 q8h from q12h due to elevated 1700-1800 BPs.   Cynthia Carroll, Jr MD [facility]ending [facility]ulty Practice)

## 2016-04-04 NOTE — Progress Notes (Signed)
POSTPARTUM PROGRESS NOTE  Post Partum Day 2 Subjective:  Steffanie DunnKimberly A Schear is a 20 y.o. G1P0101 10412w1d s/p pLTCS for fetal indications.  Pt partum course was complicated by seizure during delivery, s/p 24hr pp Mg and no repeat seizures.  Pt denies problems with HA, CP, changes in vision, SOB, RUQ pain, BLE swelling.  Pt reports ambulating, voiding or po intake without difficulty.  She denies nausea or vomiting.  Pain is well controlled.  She has had flatus. She has not had bowel movement.  Lochia Minimal.   Objective: Blood pressure 141/95, pulse 80, temperature 98.2 F (36.8 C), temperature source Oral, resp. rate 18, height 5\' 4"  (1.626 m), weight 163 lb 5.8 oz (74.1 kg), SpO2 95 %, unknown if currently breastfeeding.  Physical Exam:  General: alert, cooperative and no distress Lochia:normal flow Chest: CTAB Heart: RRR no m/r/g Abdomen: +BS, soft, nontender,  Uterine Fundus: firm, 2 below umbilicus DVT Evaluation: No calf swelling or tenderness Extremities: No edema   Recent Labs  04/02/16 1555 04/03/16 0547  HGB 10.4* 10.0*  HCT 30.4* 29.5*    Assessment/Plan:  ASSESSMENT: Steffanie DunnKimberly A Monarch is a 20 y.o. G1P0101 5812w1d s/p pLTCS for fetal indications, now eclampsia  #Eclampsia -s/p pp Mg -Cont labetalol 200 q12hr -Monitor BP, if >160/110 consider adding HCTZ, amlodipine for control  #Postpartum -Except d/c tomorrow -Breastfeeding when possible -Infant in NICU for respiratory distress, GBS unknown, prematurity and SGA   LOS: 3 days   Olena LeatherwoodKelly M Treniyah Lynn 04/04/2016, 7:58 AM

## 2016-04-04 NOTE — Progress Notes (Signed)
MD notified of pt BP and  c/o left eye feeling "strange" stating that "its not really blurred but its not clear" per pt. Per MD, give next dose of Labetalol now.

## 2016-04-04 NOTE — Progress Notes (Signed)
Patient not in room. Will come by later to see.  Pt put back on labetalol; i couldn't see why this was discontinued.  Cornelia Copaharlie Makisha Marrin, Jr MD [facility]ending [facility])

## 2016-04-05 LAB — COMPREHENSIVE METABOLIC PANEL
ALK PHOS: 103 U/L (ref 38–126)
ALT: 15 U/L (ref 14–54)
ANION GAP: 10 (ref 5–15)
AST: 22 U/L (ref 15–41)
Albumin: 3.1 g/dL — ABNORMAL LOW (ref 3.5–5.0)
BILIRUBIN TOTAL: 0.4 mg/dL (ref 0.3–1.2)
BUN: 16 mg/dL (ref 6–20)
CALCIUM: 9.2 mg/dL (ref 8.9–10.3)
CO2: 24 mmol/L (ref 22–32)
CREATININE: 0.87 mg/dL (ref 0.44–1.00)
Chloride: 100 mmol/L — ABNORMAL LOW (ref 101–111)
GFR calc non Af Amer: >60 mL/min (ref >60)
GLUCOSE: 74 mg/dL (ref 65–99)
Potassium: 4 mmol/L (ref 3.5–5.1)
Sodium: 134 mmol/L — ABNORMAL LOW (ref 135–145)
TOTAL PROTEIN: 7.4 g/dL (ref 6.5–8.1)

## 2016-04-05 LAB — CBC
HCT: 32.8 % — ABNORMAL LOW (ref 36.0–46.0)
HEMOGLOBIN: 11.3 g/dL — AB (ref 12.0–15.0)
MCH: 32.1 pg (ref 26.0–34.0)
MCHC: 34.5 g/dL (ref 30.0–36.0)
MCV: 93.2 fL (ref 78.0–100.0)
PLATELETS: 237 10*3/uL (ref 150–400)
RBC: 3.52 MIL/uL — AB (ref 3.87–5.11)
RDW: 13.9 % (ref 11.5–15.5)
WBC: 14.1 10*3/uL — ABNORMAL HIGH (ref 4.0–10.5)

## 2016-04-05 MED ORDER — AMLODIPINE BESYLATE 10 MG PO TABS
10.0000 mg | ORAL_TABLET | Freq: Every day | ORAL | Status: DC
  Administered 2016-04-05: 10 mg via ORAL
  Filled 2016-04-05 (×2): qty 1

## 2016-04-05 MED ORDER — SODIUM CHLORIDE 0.9% FLUSH
3.0000 mL | Freq: Two times a day (BID) | INTRAVENOUS | Status: DC
  Administered 2016-04-05: 3 mL via INTRAVENOUS

## 2016-04-05 MED ORDER — HYDROCHLOROTHIAZIDE 12.5 MG PO CAPS: 12.5000 mg | ORAL_CAPSULE | Freq: Every day | ORAL | Status: DC

## 2016-04-05 MED ORDER — HYDROCHLOROTHIAZIDE 25 MG PO TABS
25.0000 mg | ORAL_TABLET | Freq: Every day | ORAL | Status: DC
  Filled 2016-04-05: qty 1

## 2016-04-05 MED ORDER — HYDROCHLOROTHIAZIDE 12.5 MG PO CAPS
12.5000 mg | ORAL_CAPSULE | Freq: Every day | ORAL | Status: DC
  Administered 2016-04-05: 12.5 mg via ORAL
  Filled 2016-04-05 (×2): qty 1

## 2016-04-05 MED ORDER — SENNOSIDES-DOCUSATE SODIUM 8.6-50 MG PO TABS: 1.0000 | ORAL_TABLET | Freq: Every evening | ORAL | Status: DC | PRN

## 2016-04-05 MED ORDER — OXYCODONE HCL 5 MG PO TABS: 5.0000 mg | ORAL_TABLET | Freq: Four times a day (QID) | ORAL | Status: DC | PRN

## 2016-04-05 NOTE — Discharge Summary (Signed)
OB Discharge Summary     Patient Name: Cynthia Carroll DOB: 1996/01/24 MRN: [id number]  Date of admission: 03/29/2016 Delivering MD: Catalina AntiguaONSTANT, PEGGY   Date of discharge: 04/05/2016  Admitting diagnosis: 32 WEEKS HBP 160 102 Intrauterine pregnancy: 3229w1d     Secondary diagnosis:  Active Problems:   Preeclampsia, severe   Eclampsia, delivered status post primary cesarean section  Additional problems: eclamptic seizure     Discharge diagnosis: Preterm Pregnancy Delivered                                                                                                Post partum procedures: 24 hours PP Mg  Augmentation: none  Complications: eclamptic seizure  Hospital course:    20 y.o. yo G1P0101 at 2529w1d was admitted on 03/29/2016 with preeclampsia. She was managed expectantly, started on labetalol, and given BMZ x2. For fetal indications decision made to proceed to c/s on 6/24. She suffered eclamptic seizure in the OR. She was treated with 24 hours PP Mg. Will discharge on HCTZ 12.5 with plan to monitor BP daily and contact clinic for elevated BPs. Patient complained of persistent left eye blurry vision postpartum. Dr. Dione BoozeGroat from ophtho was consulted and recommended f/u in his clinic this week.    Details of operation can be found in separate operative note. Patient had an uncomplicated postpartum course.  She is ambulating,tolerating a regular diet, passing flatus, and urinating well.  Patient is discharged home in stable condition 04/05/2016.  Physical exam  Filed Vitals:   04/05/16 0841 04/05/16 1107 04/05/16 1200 04/05/16 1208  BP: 150/102 128/84  133/90  Pulse: 76 98  97  Temp: 98.3 F (36.8 C)  98.4 F (36.9 C)   TempSrc: Oral  Oral   Resp: 16     Height:      Weight:      SpO2:       General: alert, cooperative and no distress Lochia: appropriate Uterine Fundus: firm Incision: No significant erythema, Dressing is clean, dry, and intact DVT Evaluation: No  cords or calf tenderness. No significant calf/ankle edema. Labs: Lab Results  Component Value Date   WBC 14.1* 04/05/2016   HGB 11.3* 04/05/2016   HCT 32.8* 04/05/2016   MCV 93.2 04/05/2016   PLT 237 04/05/2016   CMP Latest Ref Rng 04/05/2016  Glucose 65 - 99 mg/dL 74  BUN 6 - 20 mg/dL 16  Creatinine 4.090.44 - 8.111.00 mg/dL 9.140.87  Sodium 782135 - 956145 mmol/L 134(L)  Potassium 3.5 - 5.1 mmol/L 4.0  Chloride 101 - 111 mmol/L 100(L)  CO2 22 - 32 mmol/L 24  Calcium 8.9 - 10.3 mg/dL 9.2  Total Protein 6.5 - 8.1 g/dL 7.4  Total Bilirubin 0.3 - 1.2 mg/dL 0.4  Alkaline Phos 38 - 126 U/L 103  AST 15 - 41 U/L 22  ALT 14 - 54 U/L 15    Discharge instruction: per After Visit Summary and "Baby and Me Booklet".  After visit meds:    Medication List    TAKE these medications        acetaminophen 325  MG tablet  Commonly known as:  TYLENOL  Take 325 mg by mouth every 6 (six) hours as needed for headache.     [employer] PRENATAL GUMMY PO  Take 2 tablets by mouth daily.     hydrochlorothiazide 12.5 MG capsule  Commonly known as:  MICROZIDE  Take 1 capsule (12.5 mg total) by mouth daily.     oxyCODONE 5 MG immediate release tablet  Commonly known as:  Oxy IR/ROXICODONE  Take 1 tablet (5 mg total) by mouth every 6 (six) hours as needed (pain scale 4-7).     senna-docusate 8.6-50 MG tablet  Commonly known as:  Senokot-S  Take 1 tablet by mouth at bedtime as needed for mild constipation.        Diet: routine diet  Activity: Advance as tolerated. Pelvic rest for 6 weeks.   Outpatient follow up:6 weeks Follow up Appt:No future appointments. Follow up Visit:No Follow-up on file.  Postpartum contraception: undecided  Newborn Data: Live born female  Birth Weight: 3 lb 6 oz (1530 g) APGAR: 6, 8  Baby Feeding: Bottle and Breast Disposition:NICU   04/05/2016 Silvano BilisNoah B Seidy Labreck, MD

## 2016-04-05 NOTE — Discharge Instructions (Signed)
Check your blood pressure daily while at home and contact the [facility] clinic if your systolic blood pressure is 150 or greater, or if your diastolic blood pressure is 100 or greater.   Postpartum Care After Cesarean Delivery After you deliver your newborn (postpartum period), the usual stay in the hospital is 24-72 hours. If there were problems with your labor or delivery, or if you have other medical problems, you might be in the hospital longer.  While you are in the hospital, you will receive help and instructions on how to care for yourself and your newborn during the postpartum period.  While you are in the hospital:  It is normal for you to have pain or discomfort from the incision in your abdomen. Be sure to tell your nurses when you are having pain, where the pain is located, and what makes the pain worse.  If you are breastfeeding, you may feel uncomfortable contractions of your uterus for a couple of weeks. This is normal. The contractions help your uterus get back to normal size.  It is normal to have some bleeding after delivery.  For the first 1-3 days after delivery, the flow is red and the amount may be similar to a period.  It is common for the flow to start and stop.  In the first few days, you may pass some small clots. Let your nurses know if you begin to pass large clots or your flow increases.  Do not  flush blood clots down the toilet before having the nurse look at them.  During the next 3-10 days after delivery, your flow should become more watery and pink or brown-tinged in color.  Ten to fourteen days after delivery, your flow should be a small amount of yellowish-white discharge.  The amount of your flow will decrease over the first few weeks after delivery. Your flow may stop in 6-8 weeks. Most women have had their flow stop by 12 weeks after delivery.  You should change your sanitary pads frequently.  Wash your hands thoroughly with soap and water for at  least 20 seconds after changing pads, using the toilet, or before holding or feeding your newborn.  Your intravenous (IV) tubing will be removed when you are drinking enough fluids.  The urine drainage tube (urinary catheter) that was inserted before delivery may be removed within 6-8 hours after delivery or when feeling returns to your legs. You should feel like you need to empty your bladder within the first 6-8 hours after the catheter has been removed.  In case you become weak, lightheaded, or faint, call your nurse before you get out of bed for the first time and before you take a shower for the first time.  Within the first few days after delivery, your breasts may begin to feel tender and full. This is called engorgement. Breast tenderness usually goes away within 48-72 hours after engorgement occurs. You may also notice milk leaking from your breasts. If you are not breastfeeding, do not stimulate your breasts. Breast stimulation can make your breasts produce more milk.  Spending as much time as possible with your newborn is very important. During this time, you and your newborn can feel close and get to know each other. Having your newborn stay in your room (rooming in) will help to strengthen the bond with your newborn. It will give you time to get to know your newborn and become comfortable caring for your newborn.  Your hormones change after delivery. Sometimes  the hormone changes can temporarily cause you to feel sad or tearful. These feelings should not last more than a few days. If these feelings last longer than that, you should talk to your caregiver.  If desired, talk to your caregiver about methods of family planning or contraception.  Talk to your caregiver about immunizations. Your caregiver may want you to have the following immunizations before leaving the hospital:  Tetanus, diphtheria, and pertussis (Tdap) or tetanus and diphtheria (Td) immunization. It is very important  that you and your family (including grandparents) or others caring for your newborn are up-to-date with the Tdap or Td immunizations. The Tdap or Td immunization can help protect your newborn from getting ill.  Rubella immunization.  Varicella (chickenpox) immunization.  Influenza immunization. You should receive this annual immunization if you did not receive the immunization during your pregnancy.   This information is not intended to replace advice given to you by your health care provider. Make sure you discuss any questions you have with your health care provider.   Document Released: 06/20/2012 Document Reviewed: 06/20/2012 Elsevier Interactive Patient Education [employer]2016 Elsevier Inc. Postpartum Depression and Baby Blues The postpartum period begins right after the birth of a baby. During this time, there is often a great amount of joy and excitement. It is also a time of many changes in the life of the parents. Regardless of how many times a mother gives birth, each child brings new challenges and dynamics to the family. It is not unusual to have feelings of excitement along with confusing shifts in moods, emotions, and thoughts. All mothers are at risk of developing postpartum depression or the "baby blues." These mood changes can occur right after giving birth, or they may occur many months after giving birth. The baby blues or postpartum depression can be mild or severe. Additionally, postpartum depression can go away rather quickly, or it can be a long-term condition.  CAUSES Raised hormone levels and the rapid drop in those levels are thought to be a main cause of postpartum depression and the baby blues. A number of hormones change during and after pregnancy. Estrogen and progesterone usually decrease right after the delivery of your baby. The levels of thyroid hormone and various cortisol steroids also rapidly drop. Other factors that play a role in these mood changes include major life  events and genetics.  RISK FACTORS If you have any of the following risks for the baby blues or postpartum depression, know what symptoms to watch out for during the postpartum period. Risk factors that may increase the likelihood of getting the baby blues or postpartum depression include: Having a personal or family history of depression.  Having depression while being pregnant.  Having premenstrual mood issues or mood issues related to oral contraceptives. Having a lot of life stress.  Having marital conflict.  Lacking a social support network.  Having a baby with special needs.  Having health problems, such as diabetes.  SIGNS AND SYMPTOMS Symptoms of baby blues include: Brief changes in mood, such as going from extreme happiness to sadness. Decreased concentration.  Difficulty sleeping.  Crying spells, tearfulness.  Irritability.  Anxiety.  Symptoms of postpartum depression typically begin within the first month after giving birth. These symptoms include: Difficulty sleeping or excessive sleepiness.  Marked weight loss.  Agitation.  Feelings of worthlessness.  Lack of interest in activity or food.  Postpartum psychosis is a very serious condition and can be dangerous. Fortunately, it is rare. Displaying any of  the following symptoms is cause for immediate medical attention. Symptoms of postpartum psychosis include:  Hallucinations and delusions.  Bizarre or disorganized behavior.  Confusion or disorientation.  DIAGNOSIS  A diagnosis is made by an evaluation of your symptoms. There are no medical or lab tests that lead to a diagnosis, but there are various questionnaires that a health care provider may use to identify those with the baby blues, postpartum depression, or psychosis. Often, a screening tool called the [place] Postnatal Depression Scale is used to diagnose depression in the postpartum period.  TREATMENT The baby blues usually goes away on its own  in 1-2 weeks. Social support is often all that is needed. You will be encouraged to get adequate sleep and rest. Occasionally, you may be given medicines to help you sleep.  Postpartum depression requires treatment because it can last several months or longer if it is not treated. Treatment may include individual or group therapy, medicine, or both to address any social, physiological, and psychological factors that may play a role in the depression. Regular exercise, a healthy diet, rest, and social support may also be strongly recommended.  Postpartum psychosis is more serious and needs treatment right away. Hospitalization is often needed. HOME CARE INSTRUCTIONS Get as much rest as you can. Nap when the baby sleeps.  Exercise regularly. Some women find yoga and walking to be beneficial.  Eat a balanced and nourishing diet.  Do little things that you enjoy. Have a cup of tea, take a bubble bath, read your favorite magazine, or listen to your favorite music. Avoid alcohol.  Ask for help with household chores, cooking, grocery shopping, or running errands as needed. Do not try to do everything.  Talk to people close to you about how you are feeling. Get support from your partner, family members, friends, or other new moms. Try to stay positive in how you think. Think about the things you are grateful for.  Do not spend a lot of time alone.  Only take over-the-counter or prescription medicine as directed by your health care provider. Keep all your postpartum appointments.  Let your health care provider know if you have any concerns.  SEEK MEDICAL CARE IF: You are having a reaction to or problems with your medicine. SEEK IMMEDIATE MEDICAL CARE IF: You have suicidal feelings.  You think you may harm the baby or someone else. MAKE SURE YOU: Understand these instructions. Will watch your condition. Will get help right away if you are not doing well or get worse.   This information is  not intended to replace advice given to you by your health care provider. Make sure you discuss any questions you have with your health care provider.   Document Released: 06/30/2004 Document Revised: 10/01/2013 Document Reviewed: 07/08/2013 Elsevier Interactive Patient Education 2016 Elsevier Inc. Preeclampsia and Eclampsia Preeclampsia is a serious condition that develops only during pregnancy. It is also called toxemia of pregnancy. This condition causes high blood pressure along with other symptoms, such as swelling and headaches. These may develop as the condition gets worse. Preeclampsia may occur 20 weeks or later into your pregnancy.  Diagnosing and treating preeclampsia early is very important. If not treated early, it can cause serious problems for you and your baby. One problem it can lead to is eclampsia, which is a condition that causes muscle jerking or shaking (convulsions) in the mother. Delivering your baby is the best treatment for preeclampsia or eclampsia.  RISK FACTORS The cause of preeclampsia is  not known. You may be more likely to develop preeclampsia if you have certain risk factors. These include:   Being pregnant for the first time.  Having preeclampsia in a past pregnancy.  Having a family history of preeclampsia.  Having high blood pressure.  Being pregnant with twins or triplets.  Being 5935 or older.  Being African American.  Having kidney disease or diabetes.  Having medical conditions such as lupus or blood diseases.  Being very overweight (obese). SIGNS AND SYMPTOMS  The earliest signs of preeclampsia are:  High blood pressure.  Increased protein in your urine. Your health care provider will check for this at every prenatal visit. Other symptoms that can develop include:   Severe headaches.  Sudden weight gain.  Swelling of your hands, face, legs, and feet.  Feeling sick to your stomach (nauseous) and throwing up (vomiting).  Vision  problems (blurred or double vision).  Numbness in your face, arms, legs, and feet.  Dizziness.  Slurred speech.  Sensitivity to bright lights.  Abdominal pain. DIAGNOSIS  There are no screening tests for preeclampsia. Your health care provider will ask you about symptoms and check for signs of preeclampsia during your prenatal visits. You may also have tests, including:  Urine testing.  Blood testing.  Checking your baby's heart rate.  Checking the health of your baby and your placenta using images created with sound waves (ultrasound). TREATMENT  You can work out the best treatment approach together with your health care provider. It is very important to keep all prenatal appointments. If you have an increased risk of preeclampsia, you may need more frequent prenatal exams.  Your health care provider may prescribe bed rest.  You may have to eat as little salt as possible.  You may need to take medicine to lower your blood pressure if the condition does not respond to more conservative measures.  You may need to stay in the hospital if your condition is severe. There, treatment will focus on controlling your blood pressure and fluid retention. You may also need to take medicine to prevent seizures.  If the condition gets worse, your baby may need to be delivered early to protect you and the baby. You may have your labor started with medicine (be induced), or you may have a cesarean delivery.  Preeclampsia usually goes away after the baby is born. HOME CARE INSTRUCTIONS   Only take over-the-counter or prescription medicines as directed by your health care provider.  Lie on your left side while resting. This keeps pressure off your baby.  Elevate your feet while resting.  Get regular exercise. Ask your health care provider what type of exercise is safe for you.  Avoid caffeine and alcohol.  Do not smoke.  Drink 6-8 glasses of water every day.  Eat a balanced diet that  is low in salt. Do not add salt to your food.  Avoid stressful situations as much as possible.  Get plenty of rest and sleep.  Keep all prenatal appointments and tests as scheduled. SEEK MEDICAL CARE IF:  You are gaining more weight than expected.  You have any headaches, abdominal pain, or nausea.  You are bruising more than usual.  You feel dizzy or light-headed. SEEK IMMEDIATE MEDICAL CARE IF:   You develop sudden or severe swelling anywhere in your body. This usually happens in the legs.  You gain 5 lb (2.3 kg) or more in a week.  You have a severe headache, dizziness, problems with your vision,  or confusion.  You have severe abdominal pain.  You have lasting nausea or vomiting.  You have a seizure.  You have trouble moving any part of your body.  You develop numbness in your body.  You have trouble speaking.  You have any abnormal bleeding.  You develop a stiff neck.  You pass out. MAKE SURE YOU:   Understand these instructions.  Will watch your condition.  Will get help right away if you are not doing well or get worse.   This information is not intended to replace advice given to you by your health care provider. Make sure you discuss any questions you have with your health care provider.   Document Released: 09/23/2000 Document Revised: 10/01/2013 Document Reviewed: 07/19/2013 Elsevier Interactive Patient Education [employer]

## 2016-04-05 NOTE — Progress Notes (Signed)
Seizure precautions.  Bed with pads over rails.

## 2016-04-05 NOTE — Lactation Note (Signed)
This note was copied from a baby's chart. Lactation Consultation Note  Patient Name: Cynthia Carroll ZOXWR'UToday's Date: 04/05/2016 Reason for consult: Follow-up assessment;NICU baby;Infant < 6lbs   Follow up with mom on Antenatal unit prior to d/c. Mom has been pumping every 3 hours and milk is coming in. She says she is feeling fuller today. Enc her to pump every 2-3 hours with a 4-5 hour stretch at night of no pumping to rest. Informed her of pumping rooms in NICU for use when visiting infant.   Engorgement prevention/treatment reviewed with mom. Reviewed milk storage for NICU infant and transporting milk to hospital on ice. Mom reports her infant is feeding via NG. She is getting to hold him STS.   [facility]. Mom to call when ready for pump rental today.    Maternal Data Formula Feeding for Exclusion: No Has patient been taught Hand Expression?: Yes  Feeding Feeding Type: Formula Length of feed: 30 min  LATCH Score/Interventions                      Lactation Tools Discussed/Used WIC Program: Yes [facility]([place]) Pump Review: Setup, frequency, and cleaning;Milk Storage Initiated by:: Reviewed Date initiated:: 04/05/16   Consult Status Consult Status: PRN Follow-up type: Call as needed    Ed BlalockSharon S Greg Eckrich 04/05/2016, 4:13 PM

## 2016-04-05 NOTE — Progress Notes (Signed)
Patient complains of persistent blurriness left eye. CN 2-12 grossly intact. No appreciable visual field loss on exam. No ha, no sob, no upper abdominal pain. Called on-call ophthalmologist Dr. Dione BoozeGroat; he does not recommend ophtho exam while inpatient but does recommend dilated eye exam within the next 2-3 days and provided contact information for his clinic. Will provide to patient at discharge.

## 2016-04-05 NOTE — Progress Notes (Signed)
Daily Post Partum Note  Cynthia Carroll is a 20 y.o. G1P0101  POD#3 s/p pLTCS @ 8935w1d for eclampsia and non reassuring fetal status  Pregnancy c/b BMI 28, h/o UTI and chlamydia  24hr/overnight events:  Left blurry eye vision ?better but still there.   Subjective:  Meeting all post op goals including flatus  Objective:   Filed Vitals:   04/04/16 1851 04/04/16 2016 04/05/16 0005 04/05/16 0619  BP: 145/81 155/99 141/87 134/92  Pulse: 74 75 67 86  Temp:  98.1 F (36.7 C) 98.2 F (36.8 C) 98.2 F (36.8 C)  TempSrc:  Oral Oral Oral  Resp:  18 18 18   Height:      Weight:      SpO2:         Current Vital Signs 24h Vital Sign Ranges  T 98.2 F (36.8 C) Temp  Avg: 98.3 F (36.8 C)  Min: 98.1 F (36.7 C)  Max: 98.5 F (36.9 C)  BP (!) 134/92 mmHg BP  Min: 130/85  Max: 166/109  HR 86 Pulse  Avg: 77.8  Min: 67  Max: 86  RR 18 Resp  Avg: 17.5  Min: 16  Max: 18  SaO2 95 % Not Delivered No Data Recorded       24 Hour I/O Current Shift I/O  Time Ins Outs        General: NAD Abdomen: Firm fundus below the umbilicus, c/d/i incision with steri strips in place Perineum: deferred Skin:  Warm and dry.  Cardiovascular: S1, S2 normal, no murmur, rub or gallop, regular rate and rhythm Respiratory:  Clear to auscultation bilateral. Normal respiratory effort Extremities: no c/c/e  Medications Current Facility-Administered Medications  Medication Dose Route Frequency Provider Last Rate Last Dose  . acetaminophen (TYLENOL) tablet 650 mg  650 mg Oral Q4H PRN Cynthia Constant, MD      . coconut oil  1 application Topical PRN Cynthia Constant, MD      . witch hazel-glycerin (TUCKS) pad 1 application  1 application Topical PRN Cynthia Constant, MD       And  . dibucaine (NUPERCAINAL) 1 % rectal ointment 1 application  1 application Rectal PRN Cynthia Constant, MD      . diphenhydrAMINE (BENADRYL) injection 12.5 mg  12.5 mg Intravenous Q4H PRN Cynthia BlueKyle Jackson, MD       Or  . diphenhydrAMINE  (BENADRYL) capsule 25 mg  25 mg Oral Q4H PRN Cynthia BlueKyle Jackson, MD      . diphenhydrAMINE (BENADRYL) capsule 25 mg  25 mg Oral Q6H PRN Cynthia Constant, MD      . ibuprofen (ADVIL,MOTRIN) tablet 600 mg  600 mg Oral Q6H PRN Cynthia [place] Bingharlie Shaquetta Arcos, MD      . labetalol (NORMODYNE) tablet 200 mg  200 mg Oral Q8H Cynthia Bingharlie Biruk Troia, MD   200 mg at 04/05/16 0618  . menthol-cetylpyridinium (CEPACOL) lozenge 3 mg  1 lozenge Oral Q2H PRN Cynthia Constant, MD      . nalbuphine (NUBAIN) injection 5 mg  5 mg Intravenous Q4H PRN Cynthia BlueKyle Jackson, MD       Or  . nalbuphine (NUBAIN) injection 5 mg  5 mg Subcutaneous Q4H PRN Cynthia BlueKyle Jackson, MD      . nalbuphine (NUBAIN) injection 5 mg  5 mg Intravenous Once PRN Cynthia BlueKyle Jackson, MD       Or  . nalbuphine (NUBAIN) injection 5 mg  5 mg Subcutaneous Once PRN Cynthia BlueKyle Jackson, MD      . naloxone [facility](NARCAN) 2 mg in  dextrose 5 % 250 mL infusion  1-4 mcg/kg/hr Intravenous Continuous PRN Cynthia BlueKyle Jackson, MD      . naloxone [facility](NARCAN) injection 0.4 mg  0.4 mg Intravenous PRN Cynthia BlueKyle Jackson, MD       And  . sodium chloride flush (NS) 0.9 % injection 3 mL  3 mL Intravenous PRN Cynthia BlueKyle Jackson, MD      . ondansetron [facility](ZOFRAN) injection 4 mg  4 mg Intravenous Q8H PRN Cynthia BlueKyle Jackson, MD      . oxyCODONE (Oxy IR/ROXICODONE) immediate release tablet 10 mg  10 mg Oral Q4H PRN Cynthia Constant, MD      . oxyCODONE (Oxy IR/ROXICODONE) immediate release tablet 5 mg  5 mg Oral Q4H PRN Cynthia Constant, MD      . prenatal vitamin w/FE, FA (NATACHEW) chewable tablet 1 tablet  1 tablet Oral Q1200 Cynthia Rosenthalarolyn Harraway-Smith, MD   1 tablet at 04/04/16 1219  . scopolamine (TRANSDERM-SCOP) 1 MG/3DAYS 1.5 mg  1 patch Transdermal Once Cynthia BlueKyle Jackson, MD      . senna-docusate (Senokot-S) tablet 1 tablet  1 tablet Oral QHS PRN Cynthia Bingharlie Aften Lipsey, MD      . simethicone (MYLICON) chewable tablet 80 mg  80 mg Oral TID PC Cynthia Constant, MD   80 mg at 04/04/16 1740  . Tdap (BOOSTRIX) injection 0.5 mL  0.5 mL Intramuscular Once Cynthia Constant, MD   0.5 mL  at 04/03/16 1000    Labs:   Recent Labs Lab 04/02/16 0659 04/02/16 1555 04/03/16 0547  WBC 14.3* 19.9* 16.7*  HGB 11.0* 10.4* 10.0*  HCT 31.6* 30.4* 29.5*  PLT 256 246 215    Recent Labs Lab 04/02/16 0659 04/03/16 0547  NA 135 133*  K 3.8 3.9  CL 107 103  CO2 21* 25  BUN 17 16  CREATININE 0.70 0.70  GLUCOSE 78 87  CALCIUM 8.4* 7.3*    Assessment & Plan:  Pt stable *Postpartum/postop: routine care. Undecided on birth control. breast *Eclampsia: continue with labetalol. Will call optho today to evaluate left eye *Dispo: possibly today. Needs BP check in one week  Cynthia Copaharlie Terreon Ekholm, Jr. MD Attending Center for [facility](Faculty Practice)

## 2016-04-06 ENCOUNTER — Ambulatory Visit: Payer: Self-pay

## 2016-04-06 NOTE — Lactation Note (Signed)
This note was copied from a baby's chart. Lactation Consultation Note  Patient Name: Cynthia Varney BaasKimberly Carroll UJWJX'BToday's Date: 04/06/2016 Reason for consult: Follow-up assessment;NICU baby;Infant < 6lbs   Briefly spoke with mom at infant's bedside. She reports pumping went well last night. She denies s/s/ engorgement. Follow up prn.   Maternal Data    Feeding    LATCH Score/Interventions                      Lactation Tools Discussed/Used WIC Program: Yes   Consult Status Consult Status: PRN Follow-up type: Call as needed    Ed BlalockSharon S Mareo Portilla 04/06/2016, 9:54 AM

## 2016-04-07 ENCOUNTER — Ambulatory Visit: Payer: Self-pay

## 2016-04-07 NOTE — Lactation Note (Signed)
This note was copied from a baby's chart. Lactation Consultation Note  Patient Name: Boy Varney BaasKimberly Fontaine ZOXWR'UToday's Date: 04/07/2016 Reason for consult: Follow-up assessment NICU baby 625 days old, 379w6d CGA. Assisted to latch baby to left breast in football position. Baby fussy at breast and would not suckle. Allowed baby to suckle this LC's gloved finger and baby attempted to latch to mom's breast but then would not suckle. Demonstrated to mom how to hand express EBM and dribble in baby's mouth. After tasting EBM, baby opened his mouth wide and attempted to latch, but again could not elicit a suckle at the breast. Baby sleepy at breast and no longer willing to open mouth. Discussed with mom that this was a good first attempt at the breast, and discussed the benefits of latching baby as she and baby able.   Mom states that she is getting 2-3 ounces of EBM when she pumps. Enc mom to keep pumping at least 8 times/24 hours followed by hand expression, and discussed sleepy at night for 4-5 hours. Mom aware of OP/BFSG and LC phone line assistance.   Maternal Data    Feeding Feeding Type: Breast Fed Length of feed: 0 min  LATCH Score/Interventions Latch: Too sleepy or reluctant, no latch achieved, no sucking elicited. Intervention(s): Skin to skin;Teach feeding cues;Waking techniques  Audible Swallowing: None Intervention(s): Skin to skin;Hand expression  Type of Nipple: Everted at rest and after stimulation  Comfort (Breast/Nipple): Soft / non-tender     Hold (Positioning): Assistance needed to correctly position infant at breast and maintain latch. Intervention(s): Breastfeeding basics reviewed;Support Pillows;Position options;Skin to skin  LATCH Score: 5  Lactation Tools Discussed/Used     Consult Status Consult Status: PRN    [name], Ercelle Winkles 04/07/2016, 2:23 PM

## 2016-04-07 NOTE — Lactation Note (Signed)
This note was copied from a baby's chart. Lactation Consultation Note  Patient Name: Cynthia Carroll ZOXWR'UToday's Date: 04/07/2016 Reason for consult: Follow-up assessment;NICU baby NICU baby 135 days old. Mom had requested assistance with latching baby in NICU at the 1100 feeding. Enc patient's bedside nurse, Inetta Fermoina, RN to call for the 1400 feeding if mom wanting assistance.   Maternal Data    Feeding Feeding Type: Breast Milk Length of feed: 30 min  LATCH Score/Interventions                      Lactation Tools Discussed/Used     Consult Status Consult Status: PRN    [name], [name] 04/07/2016, 12:36 PM

## 2016-04-15 ENCOUNTER — Ambulatory Visit (INDEPENDENT_AMBULATORY_CARE_PROVIDER_SITE_OTHER): Payer: 59 | Admitting: Family

## 2016-04-15 ENCOUNTER — Encounter: Payer: Self-pay | Admitting: Family

## 2016-04-15 VITALS — BP 119/78 | HR 94 | Resp 16 | Ht 64.0 in | Wt 141.0 lb

## 2016-04-15 DIAGNOSIS — Z09 Encounter for follow-up examination after completed treatment for conditions other than malignant neoplasm: Secondary | ICD-10-CM

## 2016-04-15 NOTE — Progress Notes (Signed)
   Subjective:    Patient ID: Cynthia Carroll, female    DOB: [birth year], 20 y.o.   MRN: [id number]  HPI Cynthia Carroll is here status post a emergency csection at 32 weeks for severe preeclampsia resulting in an ecclamptic seizure.  Here per recommendation of home health nurse regarding pt signs of depression.  Infant in NICU.  Cynthia Carroll reports that she was feeling sad and would cry easily due to baby being in NICU and needing to leave him there.  Denies feelings of suicidal ideation or harming others.  Reports having a good support system.  Lives with FOB, and two family friends.  States feelings are improving and baby may be coming home next week - he's doing well!!  Reports not feeling or desiring to begin meds now and will notify us if this changes.    Bleeding minimal at this time and pain well controlled.     Review of Systems Pertinent information in HPI    Objective:   Physical Exam  Constitutional: She is oriented to person, place, and time. She appears well-developed and well-nourished. No distress.  HENT:  Head: Normocephalic and atraumatic.  Neck: Normal range of motion. Neck supple. No thyromegaly present.  Neurological: She is alert and oriented to person, place, and time.  Skin: Skin is warm and dry.  Psychiatric: She has a normal mood and affect.  Smiling - showed pictures of newborn    Filed Vitals:   04/15/16 0854  BP: 119/78  Pulse: 94  Resp: 16         Assessment & Plan:  Postpartum Evaluation  Plan: Given verbal support Emphasized to contact us if difficulty coping once baby is home Encouraged making time for self at least once a week once baby is discharged Keep postpartum appointment  Cynthia Carroll, CNM

## 2016-04-18 ENCOUNTER — Encounter: Payer: Self-pay | Admitting: *Deleted

## 2016-04-29 ENCOUNTER — Ambulatory Visit: Payer: Self-pay

## 2016-04-29 NOTE — Lactation Note (Signed)
This note was copied from a baby's chart. Lactation Consultation Note  Patient Name: Cynthia Carroll ZOXWR'UToday's Date: 04/29/2016 Reason for consult: Follow-up assessment;NICU baby;Infant < 6lbs   Follow up with mom in NICU prior to d/c home. Infant is not BF currently and is now 37 weeks CGA. Mom says she plans to put him to breast after she gets home. Discussed with mom that Colief dosage can be given in small amount of EBM and then he can be put to breast to feed. Mom is pumping and has milk to feed infant and has some in storage. Mom has a DEBP at home for continued use. Infant is being BF with Colief for suspected lactose intolerance with family history. Enc mom to continue pumping every 2-3 hours.   Mom without questions at this time. She is very interested in an OP appt for assistance with BF. OP Appt made for Friday July 28. 2017 at 1 pm. Appointment reminder given. Enc mom to call LC with questions/concerns prn.     Maternal Data Formula Feeding for Exclusion: No Does the patient have breastfeeding experience prior to this delivery?: No  Feeding Feeding Type: Breast Milk  LATCH Score/Interventions                      Lactation Tools Discussed/Used WIC Program: Yes [facility]([place]) Pump Review: Setup, frequency, and cleaning;Milk Storage Initiated by:: Reviewed   Consult Status Consult Status: Follow-up Date: 05/06/16 Follow-up type: Out-patient    Cynthia FloodSharon S Maddex Carroll 04/29/2016, 9:09 AM

## 2016-05-06 ENCOUNTER — Inpatient Hospital Stay (HOSPITAL_COMMUNITY): Admission: RE | Admit: 2016-05-06 | Payer: 59 | Source: Ambulatory Visit

## 2016-05-13 ENCOUNTER — Ambulatory Visit: Payer: 59 | Admitting: Advanced Practice Midwife

## 2016-05-16 ENCOUNTER — Encounter: Payer: Self-pay | Admitting: Certified Nurse Midwife

## 2016-05-16 ENCOUNTER — Ambulatory Visit (INDEPENDENT_AMBULATORY_CARE_PROVIDER_SITE_OTHER): Payer: 59 | Admitting: Certified Nurse Midwife

## 2016-05-16 VITALS — BP 126/93 | HR 101 | Resp 16 | Ht 64.0 in | Wt 138.0 lb

## 2016-05-16 DIAGNOSIS — Z30013 Encounter for initial prescription of injectable contraceptive: Secondary | ICD-10-CM

## 2016-05-16 DIAGNOSIS — I10 Essential (primary) hypertension: Secondary | ICD-10-CM | POA: Insufficient documentation

## 2016-05-16 DIAGNOSIS — Z01812 Encounter for preprocedural laboratory examination: Secondary | ICD-10-CM

## 2016-05-16 LAB — POCT URINE PREGNANCY: Preg Test, Ur: NEGATIVE

## 2016-05-16 MED ORDER — MEDROXYPROGESTERONE ACETATE 150 MG/ML IM SUSP
150.0000 mg | Freq: Once | INTRAMUSCULAR | Status: AC
  Administered 2016-05-16: 150 mg via INTRAMUSCULAR

## 2016-05-16 NOTE — Progress Notes (Signed)
Post Partum Exam  Cynthia DunnKimberly A Carroll is a 20 y.o. 371P0101 female who presents for a postpartum visit. She is 6 weeks postpartum following a low cervical transverse Cesarean section. I have fully reviewed the prenatal and intrapartum course. The delivery was at 33 gestational weeks d/t fetal indications.  She had an intraoperative eclamptic seizure. Anesthesia: epidural. Postpartum course has been complicated by PP depression but scored a 0 on depression scale today, doing better now. Baby was in NICU for 1 month after delivery. Baby is feeding by bottle - Similac Alimentum. Bleeding no bleeding. Bowel function is normal. Bladder function is normal. Patient is sexually active x1, used condom. Contraception method is condoms but would like to start Depo Provera.   Postpartum depression screening: negative.  Review of Systems Pertinent items are noted in HPI.   Objective:    BP 126/93, HR 101, RR 16, WT 138 lbs, Breastfeeding? Yes  General:  alert, cooperative and no distress   Breasts:  inspection negative, no nipple discharge or bleeding, no masses or nodularity palpable  Lungs: normal percussion bilaterally  Heart:  regular rate and rhythm  Abdomen: soft, non-tender; bowel sounds normal; no masses,  no organomegaly; low transverse abdominal incision, pink, well healed   Vulva:  not evaluated  Vagina: not evaluated  Cervix:  Not examined  Corpus: not examined  Adnexa:  not evaluated  Rectal Exam: Not performed.        Assessment:   Normal postpartum exam.  Pap smear not done at today's visit.  Hypertension, mild  Plan:    1. Contraception:Depo Provera q12 weeks, start today  2. [facility]  3. Follow up in 1 yr for AEX or prn

## 2016-05-26 ENCOUNTER — Encounter: Payer: Self-pay | Admitting: *Deleted

## 2016-08-08 ENCOUNTER — Ambulatory Visit: Payer: Self-pay

## 2016-11-29 ENCOUNTER — Ambulatory Visit (INDEPENDENT_AMBULATORY_CARE_PROVIDER_SITE_OTHER): Payer: 59 | Admitting: Physician Assistant

## 2016-11-29 ENCOUNTER — Encounter: Payer: Self-pay | Admitting: Physician Assistant

## 2016-11-29 VITALS — BP 114/76 | HR 82 | Ht 64.0 in | Wt 128.0 lb

## 2016-11-29 DIAGNOSIS — L0291 Cutaneous abscess, unspecified: Secondary | ICD-10-CM | POA: Insufficient documentation

## 2016-11-29 DIAGNOSIS — F17200 Nicotine dependence, unspecified, uncomplicated: Secondary | ICD-10-CM

## 2016-11-29 DIAGNOSIS — Z8759 Personal history of other complications of pregnancy, childbirth and the puerperium: Secondary | ICD-10-CM | POA: Diagnosis not present

## 2016-11-29 DIAGNOSIS — Z0000 Encounter for general adult medical examination without abnormal findings: Secondary | ICD-10-CM | POA: Diagnosis not present

## 2016-11-29 DIAGNOSIS — L02414 Cutaneous abscess of left upper limb: Secondary | ICD-10-CM | POA: Diagnosis not present

## 2016-11-29 DIAGNOSIS — Z30011 Encounter for initial prescription of contraceptive pills: Secondary | ICD-10-CM | POA: Diagnosis not present

## 2016-11-29 MED ORDER — NORETHINDRONE 0.35 MG PO TABS: 1.0000 | ORAL_TABLET | Freq: Every day | ORAL | 11 refills | Status: DC

## 2016-11-29 MED ORDER — DOXYCYCLINE HYCLATE 100 MG PO TABS: 100.0000 mg | ORAL_TABLET | Freq: Two times a day (BID) | ORAL | 0 refills | Status: AC

## 2016-11-29 NOTE — Assessment & Plan Note (Signed)
Incision and drainage as above with packing. Doxycycline, I assisted Rosita KeaCharlie Cummings PA-C in the procedure. Patient will remove packing daily as is customary. Wound culture will be reviewed.

## 2016-11-29 NOTE — Patient Instructions (Addendum)
Take antibiotic twice a day with food for 7 days Leave wound covered for the next 24 hours.  Leave packing in for the next 3 days. Gently pull out 1 cm of packing each day. Keep clean and dry. No soaking in baths. Call or return if no improvement or you develop fever or worsening symptoms  Make sure you take your pill at the same time every day The efficacy (how well it prevents pregnancy) decreases significantly outside of a 3-hour window Think about getting an IUD - they are a lot more effective at preventing pregnancy  Quit smoking - call the [place] Quit Line - they are a FREE resource  Intrauterine Device Information Introduction An intrauterine device (IUD) is a medical device that gets inserted into the uterus to prevent pregnancy. It is a small, T-shaped device that has one or two nylon strings hanging down from it. The strings hang out of the lower part of the uterus (cervix) to allow for future IUD removal. There are two types of IUDs available:  Copper IUD. This type of IUD has copper wire wrapped around it. A copper IUD may last up to 10 years.  Hormone IUD. This type of IUD is made of plastic and contains the hormone progestin (synthetic progesterone). A hormone IUD may last 3 to 5 years. IUDs are inserted through the vagina and placed into the uterus with a minor medical procedure. How does the IUD work? Copper in the copper IUD prevents pregnancy by making the uterus and fallopian tubes produce a fluid that kills sperm. Synthetic progesterone in hormonal IUD prevents pregnancy by:  Thickening cervical mucus to prevent sperm from entering the uterus.  Thinning the uterine lining to prevent implantation of a fertilized egg.  Weakening or killing sperm that get into the uterus. What are the advantages of an IUD?  IUDs are highly effective, reversible, long-acting, and low-maintenance.  There are no estrogen-related side effects.  An IUD can be used when breastfeeding.  IUDs  are not associated with weight gain.  Advantages of the copper IUD are that:  It works immediately after insertion.  It does not interfere with your body's natural hormones.  It can be used for 10 years.  Advantages of the hormone IUD are that:  If it is inserted within 7 days of your period starting, it works immediately after insertion. If the hormone IUD is inserted at any other time in your cycle, you will need to use a backup method of birth control for 7 days after insertion.  It can make menstrual periods lighter.  It can decrease menstrual cramping.  It can be used for 3 or 5 years. What are the disadvantages of an IUD?  The hormone IUD may cause irregular menstrual bleeding for a period of time after insertion.  The copper IUD can make your menstrual flow heavier and more painful.  You may experience some pain during insertion, and cramping and vaginal bleeding after insertion. How is the IUD removed? Is the IUD right for me?  Your health care provider will make sure you are a good candidate for an IUD and will discuss side effects with you. This information is not intended to replace advice given to you by your health care provider. Make sure you discuss any questions you have with your health care provider. Document Released: 08/30/2004 Document Revised: 03/03/2016 Document Reviewed: 03/17/2013  2017 Elsevier  Levonorgestrel intrauterine device (IUD) What is this medicine? LEVONORGESTREL IUD (LEE voe nor jes trel)  is a contraceptive (birth control) device. The device is placed inside the uterus by a healthcare professional. It is used to prevent pregnancy. This device can also be used to treat heavy bleeding that occurs during your period. This medicine may be used for other purposes; ask your health care provider or pharmacist if you have questions. COMMON BRAND NAME(S): Cameron AliKyleena, LILETTA, Mirena, Skyla What should I tell my health care provider before I take this  medicine? They need to know if you have any of these conditions: -abnormal Pap smear -cancer of the breast, uterus, or cervix -diabetes -endometritis -genital or pelvic infection now or in the past -have more than one sexual partner or your partner has more than one partner -heart disease -history of an ectopic or tubal pregnancy -immune system problems -IUD in place -liver disease or tumor -problems with blood clots or take blood-thinners -seizures -use intravenous drugs -uterus of unusual shape -vaginal bleeding that has not been explained -an unusual or allergic reaction to levonorgestrel, other hormones, silicone, or polyethylene, medicines, foods, dyes, or preservatives -pregnant or trying to get pregnant -breast-feeding How should I use this medicine? This device is placed inside the uterus by a health care professional. Talk to your pediatrician regarding the use of this medicine in children. Special care may be needed. Overdosage: If you think you have taken too much of this medicine contact a poison control center or emergency room at once. NOTE: This medicine is only for you. Do not share this medicine with others. What if I miss a dose? This does not apply. Depending on the brand of device you have inserted, the device will need to be replaced every 3 to 5 years if you wish to continue using this type of birth control. What may interact with this medicine? Do not take this medicine with any of the following medications: -amprenavir -bosentan -fosamprenavir This medicine may also interact with the following medications: -aprepitant -barbiturate medicines for inducing sleep or treating seizures -bexarotene -griseofulvin -medicines to treat seizures like carbamazepine, ethotoin, felbamate, oxcarbazepine, phenytoin, topiramate -modafinil -pioglitazone -rifabutin -rifampin -rifapentine -some medicines to treat HIV infection like atazanavir, indinavir, lopinavir,  nelfinavir, tipranavir, ritonavir -St. John's wort -warfarin This list may not describe all possible interactions. Give your health care provider a list of all the medicines, herbs, non-prescription drugs, or dietary supplements you use. Also tell them if you smoke, drink alcohol, or use illegal drugs. Some items may interact with your medicine. What should I watch for while using this medicine? Visit your doctor or health care professional for regular check ups. See your doctor if you or your partner has sexual contact with others, becomes HIV positive, or gets a sexual transmitted disease. This product does not protect you against HIV infection (AIDS) or other sexually transmitted diseases. You can check the placement of the IUD yourself by reaching up to the top of your vagina with clean fingers to feel the threads. Do not pull on the threads. It is a good habit to check placement after each menstrual period. Call your doctor right away if you feel more of the IUD than just the threads or if you cannot feel the threads at all. The IUD may come out by itself. You may become pregnant if the device comes out. If you notice that the IUD has come out use a backup birth control method like condoms and call your health care provider. Using tampons will not change the position of the IUD and are okay to  use during your period. This IUD can be safely scanned with magnetic resonance imaging (MRI) only under specific conditions. Before you have an MRI, tell your healthcare provider that you have an IUD in place, and which type of IUD you have in place. What side effects may I notice from receiving this medicine? Side effects that you should report to your doctor or health care professional as soon as possible: -allergic reactions like skin rash, itching or hives, swelling of the face, lips, or tongue -fever, flu-like symptoms -genital sores -high blood pressure -no menstrual period for 6 weeks during  use -pain, swelling, warmth in the leg -pelvic pain or tenderness -severe or sudden headache -signs of pregnancy -stomach cramping -sudden shortness of breath -trouble with balance, talking, or walking -unusual vaginal bleeding, discharge -yellowing of the eyes or skin Side effects that usually do not require medical attention (report to your doctor or health care professional if they continue or are bothersome): -acne -breast pain -change in sex drive or performance -changes in weight -cramping, dizziness, or faintness while the device is being inserted -headache -irregular menstrual bleeding within first 3 to 6 months of use -nausea This list may not describe all possible side effects. Call your doctor for medical advice about side effects. You may report side effects to FDA at 1-800-FDA-1088. Where should I keep my medicine? This does not apply. NOTE: This sheet is a summary. It may not cover all possible information. If you have questions about this medicine, talk to your doctor, pharmacist, or health care provider.  2017 Elsevier/Gold Standard (2016-03-18 13:46:37)

## 2016-11-29 NOTE — Progress Notes (Signed)
HPI:                                                                Cynthia Carroll is a 21 y.o. female who [facility]: Primary Care Sports Medicine today to establish care  Current Concerns include birth control and left arm boil  Birth control: G1P1, 7 months postpartum. She is interested in the pill, was on the [place] shot, but does not like that it causes worsening of her acne. She is not currently sexually active. She is currently smoking 1/2 ppd. Patient did become eclamptic in her third trimester and had to have emergency C-section.  Boil: patient reports a boil x 1 month on her left upper arm just above the elbow. She has tried topical boil cream. She states initially it was tender, but over the last few days it is no longer painful. She does endorse redness and swelling. Denies fever or chills.  Health Maintenance Health Maintenance  Topic Date Due  . INFLUENZA VACCINE  06/11/2019 (Originally 05/10/2016)  . PAP SMEAR  10/11/2018  . TETANUS/TDAP  02/28/2026  . HIV Screening  Completed    Past Medical History:  Diagnosis Date  . Anxiety   . Depression   . Eclampsia affecting pregnancy    Past Surgical History:  Procedure Laterality Date  . CESAREAN SECTION N/A 04/02/2016   Procedure: CESAREAN SECTION;  Surgeon: Catalina AntiguaPeggy Constant, MD;  [facility];  Service: Obstetrics;  Laterality: N/A;  . NO PAST SURGERIES     Social History  Substance Use Topics  . Smoking status: Current Every Day Smoker    Packs/day: 0.50    Types: Cigarettes  . Smokeless tobacco: Never Used  . Alcohol use No   family history includes Celiac disease in her mother; Heart disease in her maternal grandfather; Hypertension in her mother.  ROS: negative except as noted in the HPI  Medications: Current Outpatient Prescriptions  Medication Sig Dispense Refill  . doxycycline (VIBRA-TABS) 100 MG tablet Take 1 tablet (100 mg total) by mouth 2 (two) times  daily. 14 tablet 0  . norethindrone (MICRONOR,CAMILA,ERRIN) 0.35 MG tablet Take 1 tablet (0.35 mg total) by mouth daily. 1 Package 11   No current facility-administered medications for this visit.    No Known Allergies     Objective:  BP 114/76   Pulse 82   Ht 5\' 4"  (1.626 m)   Wt 128 lb (58.1 kg)   SpO2 99%   BMI 21.97 kg/m  Gen: well-groomed, cooperative, not ill-appearing, no distress HEENT: normal conjunctiva, TM's scarred, oropharynx clear, moist mucus membranes, no thyromegaly or tenderness Pulm: Normal work of breathing, clear to auscultation bilaterally CV: Normal rate, regular rhythm, s1 and s2 distinct, no murmurs, clicks or rubs appreciated on this exam GI: soft, nondistended, nontender, no masses Neuro: alert and oriented x 3, EOM's intact, PERRLA, DTR's intact MSK: strength 5/5 and symmetric, normal gait and station, distal pulses intact, no peripheral edema Skin: warm and dry, approx 2 cm erythematous area of fluctuance on posterior distal humerus  Psych: normal affect, pleasant mood, normal speech and thought content   [facility]/01/[facility]  Decreased Interest 0  Down, Depressed, Hopeless 0  PHQ - 2 Score 0  Assessment and Plan: 21 y.o. female with   Encounter for initial prescription of contraceptive pills - patient declined STI testing - patient not a good candidate for COC due to tobacco use. Recommended she consider Nexplanon or IUD as they are much more efficacious. If she is able to quit smoking, will switch to COC - counseled that mini-pill must be taken at the same time each day and there is a greater risk of pregnancy if it is taken outside of 3 hour window - norethindrone (MICRONOR,CAMILA,ERRIN) 0.35 MG tablet; Take 1 tablet (0.35 mg total) by mouth daily.  Dispense: 1 Package; Refill: 11  Encounter for preventative adult health care examination - CBC - Comprehensive metabolic panel - Lipid Panel w/reflex Direct LDL - TSH - T4,  free - negative PHQ2. No signs of postpartum depression today  Abscess of arm, left - bedside incision & drainage performed by myself and Dr. Benjamin Stain (see procedure note) - doxycycline (VIBRA-TABS) 100 MG tablet; Take 1 tablet (100 mg total) by mouth 2 (two) times daily.  Dispense: 14 tablet; Refill: 0 - Wound culture; Future  History of eclampsia - BP in range today - will continue to monitor closely  Tobacco use disorder - counseled on smoking cessation - provided info on the [place] Quit Line  Patient education and anticipatory guidance given Patient agrees with treatment plan Follow-up in 1 year or sooner as needed  Levonne Hubert PA-C

## 2016-11-29 NOTE — Progress Notes (Signed)
   Complicated Incision and drainage of left elbow abscess. Risks, benefits, and alternatives explained and consent obtained. Time out conducted. Surface cleaned with alcohol. 2cc lidocaine infiltrated  around abscess. Adequate anesthesia ensured. Area prepped and draped in a sterile fashion. #11 blade used to make a stab incision into abscess. Pus expressed with pressure. Curved hemostat used to explore 4 quadrants and loculations broken up. Further purulence expressed. Iodoform packing placed leaving a 1-inch tail. Hemostasis achieved. Pt stable. Aftercare and follow-up advised.

## 2016-12-01 ENCOUNTER — Encounter: Payer: Self-pay | Admitting: Physician Assistant

## 2016-12-01 DIAGNOSIS — F17200 Nicotine dependence, unspecified, uncomplicated: Secondary | ICD-10-CM | POA: Insufficient documentation

## 2016-12-01 DIAGNOSIS — Z8759 Personal history of other complications of pregnancy, childbirth and the puerperium: Secondary | ICD-10-CM | POA: Insufficient documentation

## 2016-12-02 LAB — WOUND CULTURE
Gram Stain: NONE SEEN
Gram Stain: NONE SEEN
Gram Stain: NONE SEEN
ORGANISM ID, BACTERIA: NO GROWTH

## 2017-04-25 ENCOUNTER — Emergency Department (HOSPITAL_BASED_OUTPATIENT_CLINIC_OR_DEPARTMENT_OTHER)
Admission: EM | Admit: 2017-04-25 | Discharge: 2017-04-26 | Disposition: A | Discharge status: Home / Self Care | Payer: 59 | Attending: Emergency Medicine | Admitting: Emergency Medicine

## 2017-04-25 ENCOUNTER — Encounter (HOSPITAL_BASED_OUTPATIENT_CLINIC_OR_DEPARTMENT_OTHER): Payer: Self-pay

## 2017-04-25 DIAGNOSIS — R109 Unspecified abdominal pain: Secondary | ICD-10-CM

## 2017-04-25 DIAGNOSIS — F17210 Nicotine dependence, cigarettes, uncomplicated: Secondary | ICD-10-CM | POA: Insufficient documentation

## 2017-04-25 DIAGNOSIS — N3001 Acute cystitis with hematuria: Secondary | ICD-10-CM | POA: Diagnosis not present

## 2017-04-25 DIAGNOSIS — N200 Calculus of kidney: Secondary | ICD-10-CM

## 2017-04-25 DIAGNOSIS — I10 Essential (primary) hypertension: Secondary | ICD-10-CM | POA: Insufficient documentation

## 2017-04-25 DIAGNOSIS — R319 Hematuria, unspecified: Secondary | ICD-10-CM

## 2017-04-25 DIAGNOSIS — Z79899 Other long term (current) drug therapy: Secondary | ICD-10-CM | POA: Diagnosis not present

## 2017-04-25 DIAGNOSIS — R1031 Right lower quadrant pain: Secondary | ICD-10-CM | POA: Diagnosis present

## 2017-04-25 DIAGNOSIS — N390 Urinary tract infection, site not specified: Secondary | ICD-10-CM

## 2017-04-25 LAB — PREGNANCY, URINE: Preg Test, Ur: NEGATIVE

## 2017-04-25 NOTE — ED Triage Notes (Signed)
Pt reports she has a "kidney infection" - noted since 1800 with right flank pain. States this feels similar to previous episodes.

## 2017-04-26 ENCOUNTER — Emergency Department (HOSPITAL_BASED_OUTPATIENT_CLINIC_OR_DEPARTMENT_OTHER): Payer: 59

## 2017-04-26 LAB — URINALYSIS, ROUTINE W REFLEX MICROSCOPIC
Bilirubin Urine: NEGATIVE
GLUCOSE, UA: NEGATIVE mg/dL
KETONES UR: NEGATIVE mg/dL
Nitrite: POSITIVE — AB
PROTEIN: NEGATIVE mg/dL
Specific Gravity, Urine: 1.025 (ref 1.005–1.030)
pH: 5.5 (ref 5.0–8.0)

## 2017-04-26 LAB — URINALYSIS, MICROSCOPIC (REFLEX)

## 2017-04-26 MED ORDER — NITROFURANTOIN MONOHYD MACRO 100 MG PO CAPS
100.0000 mg | ORAL_CAPSULE | Freq: Once | ORAL | Status: AC
  Administered 2017-04-26: 100 mg via ORAL
  Filled 2017-04-26: qty 1

## 2017-04-26 MED ORDER — NITROFURANTOIN MONOHYD MACRO 100 MG PO CAPS: 100.0000 mg | ORAL_CAPSULE | Freq: Two times a day (BID) | ORAL | 0 refills | Status: DC

## 2017-04-26 MED ORDER — IBUPROFEN 400 MG PO TABS
400.0000 mg | ORAL_TABLET | Freq: Once | ORAL | Status: AC
  Administered 2017-04-26: 400 mg via ORAL
  Filled 2017-04-26: qty 1

## 2017-04-26 NOTE — ED Provider Notes (Signed)
[facility] Note   CSN: [id number] Arrival date & time: 04/25/17  2321     History   Chief Complaint Chief Complaint  Patient presents with  . Flank Pain    HPI Cynthia Carroll is a 21 y.o. female.  The history is provided by the patient.  She had onset this afternoon of severe pain in the right flank. Pain came on when she urinated, and resolved fairly quickly. There was associated lightheadedness. She denies fever, chills, sweats. She denies nausea or vomiting. There is no radiation of pain. When she urinated again, pain recurred. Then she started having pain even when she was not urinating. She tried taking a hot bath without any benefit. She denies urinary urgency, frequency, tenesmus, dysuria, hesitancy. She currently rates pain at 2/10.  Past Medical History:  Diagnosis Date  . Anxiety   . Depression   . Eclampsia affecting pregnancy     Patient Active Problem List   Diagnosis Date Noted  . History of eclampsia 12/01/2016  . Tobacco use disorder 12/01/2016  . Abscess of skin 11/29/2016  . Mild hypertension 05/16/2016    Past Surgical History:  Procedure Laterality Date  . CESAREAN SECTION N/A 04/02/2016   Procedure: CESAREAN SECTION;  Surgeon: Catalina AntiguaPeggy Constant, MD;  [facility];  Service: Obstetrics;  Laterality: N/A;    OB History    Gravida Para Term Preterm AB Living   1 1   1   1    SAB TAB Ectopic Multiple Live Births         0 1       Home Medications    Prior to Admission medications   Medication Sig Start Date End Date Taking? Authorizing Provider  norethindrone (MICRONOR,CAMILA,ERRIN) 0.35 MG tablet Take 1 tablet (0.35 mg total) by mouth daily. 11/29/16  Yes Carlis Stableummings, Charley Elizabeth, PA-C    Family History Family History  Problem Relation Age of Onset  . Heart disease Maternal Grandfather   . Celiac disease Mother   . Hypertension Mother     Social History Social History  Substance Use Topics    . Smoking status: Current Every Day Smoker    Packs/day: 0.50    Types: Cigarettes  . Smokeless tobacco: Never Used  . Alcohol use No     Allergies   Patient has no known allergies.   Review of Systems Review of Systems  All other systems reviewed and are negative.    Physical Exam Updated Vital Signs BP (!) 127/92 (BP Location: Left Arm)   Pulse (!) 51   Temp 98.6 F (37 C) (Oral)   Resp 15   Ht 5\' 4"  (1.626 m)   Wt 56.7 kg (125 lb)   SpO2 100%   BMI 21.46 kg/m   Physical Exam  Nursing note and vitals reviewed.  21 year old female, resting comfortably and in no acute distress. Vital signs are significant for borderline hypertension, and mild bradycardia. Oxygen saturation is 100%, which is normal. Head is normocephalic and atraumatic. PERRLA, EOMI. Oropharynx is clear. Neck is nontender and supple without adenopathy or JVD. Back is nontender and there is no CVA tenderness. Lungs are clear without rales, wheezes, or rhonchi. Chest is nontender. Heart has regular rate and rhythm without murmur. Abdomen is soft, flat, nontender without masses or hepatosplenomegaly and peristalsis is normoactive. Extremities have no cyanosis or edema, full range of motion is present. Skin is warm and dry without rash. Neurologic: Mental status is normal,  cranial nerves are intact, there are no motor or sensory deficits.  ED Treatments / Results  Labs (all labs ordered are listed, but only abnormal results are displayed) Labs Reviewed  URINALYSIS, ROUTINE W REFLEX MICROSCOPIC - Abnormal; Notable for the following:       Result Value   Hgb urine dipstick SMALL (*)    Nitrite POSITIVE (*)    Leukocytes, UA SMALL (*)    All other components within normal limits  URINALYSIS, MICROSCOPIC (REFLEX) - Abnormal; Notable for the following:    Bacteria, UA MANY (*)    Squamous Epithelial / LPF 0-5 (*)    All other components within normal limits  URINE CULTURE  PREGNANCY, URINE      Radiology Ct Renal Stone Study  Result Date: 04/26/2017 CLINICAL DATA:  21 year old female with right flank pain. EXAM: CT ABDOMEN AND PELVIS WITHOUT CONTRAST TECHNIQUE: Multidetector CT imaging of the abdomen and pelvis was performed following the standard protocol without IV contrast. COMPARISON:  None. FINDINGS: Evaluation of this exam is limited in the absence of intravenous contrast. Lower chest: The visualized lung bases are clear. No intra-abdominal free air or free fluid. Hepatobiliary: No focal liver abnormality is seen. No gallstones, gallbladder wall thickening, or biliary dilatation. Pancreas: Unremarkable. No pancreatic ductal dilatation or surrounding inflammatory changes. Spleen: Normal in size without focal abnormality. Adrenals/Urinary Tract: There are punctate nonobstructing left renal inferior pole calculi. No hydronephrosis. The right kidney is unremarkable. The visualized ureters and urinary bladder appear unremarkable. Stomach/Bowel: There is moderate stool throughout the colon. No bowel obstruction or active inflammation. Normal appendix. Vascular/Lymphatic: The abdominal aorta and IVC are grossly unremarkable on this noncontrast study. No portal venous gas identified. There is no adenopathy. Reproductive: The uterus is anteverted and grossly unremarkable. The ovaries appear unremarkable as well. No pelvic mass. Other: Diastases of anterior abdominal wall musculature with a small fat containing umbilical hernia. Musculoskeletal: No acute or significant osseous findings. IMPRESSION: 1. Punctate nonobstructing left renal calculi. No hydronephrosis. There is no hydronephrosis or nephrolithiasis on the right. 2. No bowel obstruction or active inflammation. Electronically Signed   By: Elgie Collard M.D.   On: 04/26/2017 01:49    Procedures Procedures (including critical care time)  Medications Ordered in ED Medications - No data to display   Initial Impression / Assessment  and Plan / ED Course  I have reviewed the triage vital signs and the nursing notes.  Pertinent labs & imaging results that were available during my care of the patient were reviewed by me and considered in my medical decision making (see chart for details).  Flank pain with urinating, with normal exam. Possible UTI, possible urolithiasis. Urinalysis gives a mixed picture with positive nitrite but also positive hemoglobin. Microscopic is more consistent with urinary tract infection with many bacteria and 6-30 WBCs and only 0-5 RBCs. Urine is sent for culture and she is given a dose of nitrofurantoin of. She is sent for renal stone protocol CT scan. Old records are reviewed, and she has no relevant past visits. No prior abdominal imaging.  CT scan does show small left renal calculus, not clinically significant today. Patient was advised to presence of renal calculus with possibility for renal colic in the future. She is discharged with prescription for nitrofurantoin. Return precautions discussed.  Final Clinical Impressions(s) / ED Diagnoses   Final diagnoses:  Urinary tract infection with hematuria, site unspecified  Right flank pain  Nephrolithiasis    New Prescriptions New Prescriptions  NITROFURANTOIN, MACROCRYSTAL-MONOHYDRATE, (MACROBID) 100 MG CAPSULE    Take 1 capsule (100 mg total) by mouth 2 (two) times daily. X 7 days     Dione Booze, MD 04/26/17 [phone]

## 2017-04-26 NOTE — Discharge Instructions (Signed)
Drink plenty of fluids. Take acetaminophen or ibuprofen as needed for pain. Return if pain is not being adequately controlled, you start running a high fever, or starts vomiting. Make sure to follow-up with your primary care provider to check urinalysis once you have completed the course of antibiotics.

## 2017-04-26 NOTE — ED Notes (Signed)
Patient transported to CT 

## 2017-04-26 NOTE — ED Notes (Signed)
ED Provider at bedside. 

## 2017-04-28 LAB — URINE CULTURE: Culture: >=100000 — AB

## 2017-04-29 ENCOUNTER — Telehealth: Payer: Self-pay

## 2017-04-29 NOTE — Telephone Encounter (Signed)
Post ED Visit - Positive Culture Follow-up  Culture report reviewed by antimicrobial stewardship pharmacist:  []  Enzo BiNathan Batchelder, Pharm.D. []  Celedonio MiyamotoJeremy Frens, Pharm.D., BCPS AQ-ID [x]  Garvin FilaMike Maccia, Pharm.D., BCPS []  Georgina PillionElizabeth Martin, Pharm.D., BCPS []  MagaliaMinh Pham, VermontPharm.D., BCPS, AAHIVP []  Estella HuskMichelle Turner, Pharm.D., BCPS, AAHIVP []  Lysle Pearlachel Rumbarger, PharmD, BCPS []  Casilda Carlsaylor Stone, PharmD, BCPS []  Pollyann SamplesAndy Johnston, PharmD, BCPS  Positive urine culture Treated with Cephalexin, organism sensitive to the same and no further patient follow-up is required at this time.  Cynthia Carroll, Cynthia Carroll 04/29/2017, 11:27 AM

## 2017-10-07 ENCOUNTER — Other Ambulatory Visit: Payer: Self-pay | Admitting: Physician Assistant

## 2017-10-07 DIAGNOSIS — Z30011 Encounter for initial prescription of contraceptive pills: Secondary | ICD-10-CM

## 2017-11-05 ENCOUNTER — Other Ambulatory Visit: Payer: Self-pay | Admitting: Physician Assistant

## 2017-11-05 DIAGNOSIS — Z30011 Encounter for initial prescription of contraceptive pills: Secondary | ICD-10-CM

## 2017-11-07 ENCOUNTER — Encounter: Payer: Self-pay | Admitting: Physician Assistant

## 2017-11-07 ENCOUNTER — Other Ambulatory Visit: Payer: Self-pay | Admitting: Physician Assistant

## 2017-11-07 DIAGNOSIS — Z30011 Encounter for initial prescription of contraceptive pills: Secondary | ICD-10-CM

## 2017-11-07 MED ORDER — NORETHINDRONE 0.35 MG PO TABS: 1.0000 | ORAL_TABLET | Freq: Every day | ORAL | 0 refills | Status: DC

## 2017-11-27 ENCOUNTER — Encounter: Payer: Self-pay | Admitting: Physician Assistant

## 2017-11-27 ENCOUNTER — Ambulatory Visit (INDEPENDENT_AMBULATORY_CARE_PROVIDER_SITE_OTHER): Payer: BLUE CROSS/BLUE SHIELD | Admitting: Physician Assistant

## 2017-11-27 VITALS — BP 122/77 | HR 86 | Wt 120.0 lb

## 2017-11-27 DIAGNOSIS — N3000 Acute cystitis without hematuria: Secondary | ICD-10-CM | POA: Diagnosis not present

## 2017-11-27 DIAGNOSIS — N930 Postcoital and contact bleeding: Secondary | ICD-10-CM | POA: Diagnosis not present

## 2017-11-27 DIAGNOSIS — R1903 Right lower quadrant abdominal swelling, mass and lump: Secondary | ICD-10-CM

## 2017-11-27 DIAGNOSIS — N939 Abnormal uterine and vaginal bleeding, unspecified: Secondary | ICD-10-CM | POA: Diagnosis not present

## 2017-11-27 DIAGNOSIS — Z30011 Encounter for initial prescription of contraceptive pills: Secondary | ICD-10-CM

## 2017-11-27 LAB — POCT URINALYSIS DIPSTICK
Bilirubin, UA: NEGATIVE
Glucose, UA: NEGATIVE
Ketones, UA: NEGATIVE
LEUKOCYTES UA: NEGATIVE
NITRITE UA: NEGATIVE
PROTEIN UA: NEGATIVE
Spec Grav, UA: 1.025 (ref 1.010–1.025)
Urobilinogen, UA: 0.2 E.U./dL
pH, UA: 6 (ref 5.0–8.0)

## 2017-11-27 LAB — POCT URINE PREGNANCY: PREG TEST UR: NEGATIVE

## 2017-11-27 MED ORDER — DESOGESTREL-ETHINYL ESTRADIOL 0.15-0.02/0.01 MG (21/5) PO TABS: 1.0000 | ORAL_TABLET | Freq: Every day | ORAL | 4 refills | Status: DC

## 2017-11-27 MED ORDER — NORETHINDRONE 0.35 MG PO TABS: 1.0000 | ORAL_TABLET | Freq: Every day | ORAL | 0 refills | Status: DC

## 2017-11-27 NOTE — Patient Instructions (Addendum)
-   downstairs for labs today - you will be contacted to schedule your ultrasound   - Start your new pill pack today. Throw the old one away - Take your pill daily or nightly at the same time each day. If you experience nausea, try taking it at night - If you miss a dose, take your missed pill as soon as you remember and continue on your regular schedule - If you miss more than one pill in the same week and have unprotected intercourse, use emergency contraception (Plan B) and contact our office  A good resource is https://www.bedsider.org/

## 2017-11-27 NOTE — Progress Notes (Signed)
HPI:                                                                Cynthia Carroll is a 22 y.o. female who [facility] today for contraceptive management  Current concerns: RLQ swelling  Reports 3 months of swelling in her RLQ. Denies tenderness. She does endorse constipation, having BM every other day. Denies hematochezia or melena. Denies constitutional symptoms. Denies family history of reproductive cancers.  Reports malodorous, cloudy urine in the mornings for the last 3 months. She has had a few episodes of post-coital bleeding. Denies dysuria, frequency, urgency or flank pain.  [facility]  Decreased Interest 0  Down, Depressed, Hopeless 0  PHQ - 2 Score 0    GAD 7 : Generalized Anxiety Score 04/15/2016  Nervous, Anxious, on Edge 0  Control/stop worrying 1  Worry too much - different things 1  Trouble relaxing 0  Restless 0  Easily annoyed or irritable 1  Afraid - awful might happen 0  Total GAD 7 Score 3      Past Medical History:  Diagnosis Date  . Anxiety   . Bipolar disorder (HCC)   . Depression   . Eclampsia affecting pregnancy    Past Surgical History:  Procedure Laterality Date  . CESAREAN SECTION N/A 04/02/2016   Procedure: CESAREAN SECTION;  Surgeon: Catalina AntiguaPeggy Constant, MD;  [facility];  Service: Obstetrics;  Laterality: N/A;   Social History   Tobacco Use  . Smoking status: Current Every Day Smoker    Packs/day: 0.50    Types: Cigarettes  . Smokeless tobacco: Never Used  Substance Use Topics  . Alcohol use: No    Alcohol/week: 0.0 oz   family history includes Celiac disease in her mother; Heart disease in her maternal grandfather; Hypertension in her mother.    ROS: negative except as noted in the HPI  Medications: Current Outpatient Medications  Medication Sig Dispense Refill  . desogestrel-ethinyl estradiol (KARIVA,AZURETTE,MIRCETTE)  0.15-0.02/0.01 MG (21/5) tablet Take 1 tablet by mouth daily. 3 Package 4   No current facility-administered medications for this visit.    No Known Allergies     Objective:  BP 122/77   Pulse 86   Wt 120 lb (54.4 kg)   BMI 20.60 kg/m  Gen:  alert, not ill-appearing, no distress, appropriate for age HEENT: head normocephalic without obvious abnormality, conjunctiva and cornea clear, trachea midline Pulm: Normal work of breathing, normal phonation, clear to auscultation bilaterally, no wheezes, rales or rhonchi CV: Normal rate, regular rhythm, s1 and s2 distinct, no murmurs, clicks or rubs  GI: abdomen normal appearing, no palpable fullness in the RLQ, negative McBurney's point, negative psoas sign, nontender, mild distension with standing and relaxation of the abdominal wall, no ascites, no CVA tenderness GU: vulva without rashes or lesions, normal introitus and urethral meatus, vaginal mucosa without erythema, normal discharge, cervix non-friable without lesions, adnexa without masses or tenderness, uterus non-tender and not enlarged Neuro: alert and oriented x 3, no tremor MSK: extremities atraumatic, normal gait and station Skin: intact, no rashes on exposed skin, no jaundice, no cyanosis Psych: well-groomed, cooperative, good eye contact, euthymic mood, affect mood-congruent, speech is articulate, and thought  processes clear and goal-directed   A chaperone was used for the GU portion of the exam, Olivia Mackie, CMA.   No results found for this or any previous visit (from the past 72 hour(s)). No results found.    Assessment and Plan: 22 y.o. female with  1. Encounter for initial prescription of contraceptive pills - POCT urine preg negative - switching from mini-pill to combo OCP. Patient is a smoker and accepts increased risk of blood clot. Denies personal hx of DVT/PE or family history of blood dyscrasia - active surveillance of blood pressure - desogestrel-ethinyl  estradiol (KARIVA,AZURETTE,MIRCETTE) 0.15-0.02/0.01 MG (21/5) tablet; Take 1 tablet by mouth daily.  Dispense: 3 Package; Refill: 4  2. Abdominal right lower quadrant swelling - benign exam. Differential includes constipation, ovarian cyst/mass - US PELVIS TRANSVANGINAL NON-OB (TV ONLY) - US PELVIS (TRANSABDOMINAL ONLY); Future - POCT Urinalysis Dipstick positive for trace blood.  - POCT urine pregnancy negative - Urine Culture pending  3. PCB (post coital bleeding) - no visible polyp or cervical lesion on exam - CBC with Differential/Platelet - Comprehensive metabolic panel - TSH + free T4 - US PELVIS TRANSVANGINAL NON-OB (TV ONLY) - US PELVIS (TRANSABDOMINAL ONLY); Future  4. Abnormal uterine bleeding (AUB) - ruling out structural cause with ultrasound. This may be anovulatory bleeding from mini-pill, which may resolve with switch to combo OCP.  - CBC with Differential/Platelet - Comprehensive metabolic panel - TSH + free T4 - US PELVIS TRANSVANGINAL NON-OB (TV ONLY) - US PELVIS (TRANSABDOMINAL ONLY); Future  5. Acute cystitis without hematuria - urine culture positive for E. Coli. Treating with Macrobid     Patient education and anticipatory guidance given Patient agrees with treatment plan Follow-up in 1 month for AUB or sooner as needed if symptoms worsen or fail to improve  Levonne Hubert PA-C

## 2017-11-29 MED ORDER — NITROFURANTOIN MONOHYD MACRO 100 MG PO CAPS: 100.0000 mg | ORAL_CAPSULE | Freq: Two times a day (BID) | ORAL | 0 refills | Status: DC

## 2017-11-29 NOTE — Progress Notes (Signed)
Cala BradfordKimberly,  Your urine culture was positive for a UTI. I am sending in an antibiotic to your pharmacy today. Please follow-up if you are still having symptoms after completing the antibiotic.  Best, Vinetta Bergamoharley

## 2017-11-30 LAB — URINE CULTURE
MICRO NUMBER:: 90214061
SPECIMEN QUALITY: ADEQUATE

## 2017-12-01 ENCOUNTER — Ambulatory Visit (INDEPENDENT_AMBULATORY_CARE_PROVIDER_SITE_OTHER): Payer: BLUE CROSS/BLUE SHIELD

## 2017-12-01 ENCOUNTER — Encounter: Payer: Self-pay | Admitting: Physician Assistant

## 2017-12-01 ENCOUNTER — Ambulatory Visit (INDEPENDENT_AMBULATORY_CARE_PROVIDER_SITE_OTHER): Payer: BLUE CROSS/BLUE SHIELD | Admitting: Physician Assistant

## 2017-12-01 VITALS — BP 117/67 | HR 79 | Temp 98.8°F

## 2017-12-01 DIAGNOSIS — R109 Unspecified abdominal pain: Secondary | ICD-10-CM

## 2017-12-01 DIAGNOSIS — Z87442 Personal history of urinary calculi: Secondary | ICD-10-CM

## 2017-12-01 DIAGNOSIS — R1903 Right lower quadrant abdominal swelling, mass and lump: Secondary | ICD-10-CM

## 2017-12-01 DIAGNOSIS — N3001 Acute cystitis with hematuria: Secondary | ICD-10-CM

## 2017-12-01 DIAGNOSIS — N930 Postcoital and contact bleeding: Secondary | ICD-10-CM | POA: Diagnosis not present

## 2017-12-01 DIAGNOSIS — R300 Dysuria: Secondary | ICD-10-CM | POA: Diagnosis not present

## 2017-12-01 DIAGNOSIS — R1031 Right lower quadrant pain: Secondary | ICD-10-CM | POA: Diagnosis not present

## 2017-12-01 LAB — POCT URINALYSIS DIPSTICK
Bilirubin, UA: NEGATIVE
Glucose, UA: NEGATIVE
KETONES UA: NEGATIVE
LEUKOCYTES UA: NEGATIVE
NITRITE UA: NEGATIVE
PH UA: 7.5 (ref 5.0–8.0)
PROTEIN UA: NEGATIVE
Spec Grav, UA: 1.02 (ref 1.010–1.025)
UROBILINOGEN UA: 0.2 U/dL

## 2017-12-01 MED ORDER — CIPROFLOXACIN HCL 500 MG PO TABS: 500.0000 mg | ORAL_TABLET | Freq: Two times a day (BID) | ORAL | 0 refills | Status: AC

## 2017-12-01 MED ORDER — CEFTRIAXONE SODIUM 1 G IJ SOLR
1.0000 g | Freq: Once | INTRAMUSCULAR | Status: AC
  Administered 2017-12-01: 1 g via INTRAMUSCULAR

## 2017-12-01 NOTE — Telephone Encounter (Signed)
Spoke with Pt. She was already advised by PCP's CMA to come in for a nurse visit. Pt is working on getting childcare. She will call clinic back to schedule. Advised she can bring her child with her, or she can come to the UC tonight for treatment. She reports she is about 30 min from her parents house (who are watching her child now) and will call us as soon as she gets there.

## 2017-12-01 NOTE — Progress Notes (Signed)
Good morning Cynthia Carroll,  Your ultrasound was normal. There are no ovarian cysts or masses and the uterus thickness is normal.  I think your symptoms may have been due to the urinary tract infection. Please follow-up with me if you are not feeling better after completing the antibiotic.  Best, Vinetta Bergamoharley

## 2017-12-01 NOTE — Patient Instructions (Signed)

## 2017-12-01 NOTE — Progress Notes (Signed)
Pt was seen today for acute right flank pain. Pt reports this morning she had a sharp pain that lasted for about 10 min. She has not had any pain since. Pt reports last time this happened she had a kidney stone and a UTI. POC urine dipstick and urine culture ordered. PCP advised. Rocephin 1g administered into LUOQ. Pt tolerated injection well, no immediate complications. New Rx for Cipro sent to pharmacy by PCP, Pt advised to stop Macrobid. No further questions.

## 2017-12-02 LAB — URINE CULTURE
MICRO NUMBER: 90237660
Result:: NO GROWTH
SPECIMEN QUALITY:: ADEQUATE

## 2017-12-03 ENCOUNTER — Other Ambulatory Visit: Payer: Self-pay | Admitting: Physician Assistant

## 2017-12-03 DIAGNOSIS — Z30011 Encounter for initial prescription of contraceptive pills: Secondary | ICD-10-CM

## 2017-12-04 ENCOUNTER — Encounter: Payer: Self-pay | Admitting: Physician Assistant

## 2017-12-04 DIAGNOSIS — Z87442 Personal history of urinary calculi: Secondary | ICD-10-CM | POA: Insufficient documentation

## 2017-12-04 DIAGNOSIS — R109 Unspecified abdominal pain: Secondary | ICD-10-CM | POA: Insufficient documentation

## 2017-12-04 NOTE — Progress Notes (Signed)
Good morning,  Repeat urine culture did not show any infection.  If you are still having pain, we will order a CT scan to look for a stone.  Best, Vinetta Bergamoharley

## 2017-12-04 NOTE — Progress Notes (Signed)
HPI:                                                                Cynthia Carroll is a 22 y.o. female who [facility] today for right flank pain  Patient with PMH of nephrolithiasis and UTI reported sudden onset, severe right flank pain lasting a few minutes today, but knocking her to the ground. She is currently on day 2 of Macrobid for an acute UTI. She had a CT stone study in July 2018 that showed a punctate nonobstructive calculus in the left kidney. She had a negative pelvic/transabdominal ultrasound this morning.   Past Medical History:  Diagnosis Date  . Anxiety   . Bipolar disorder (HCC)   . Depression   . Eclampsia affecting pregnancy    Past Surgical History:  Procedure Laterality Date  . CESAREAN SECTION N/A 04/02/2016   Procedure: CESAREAN SECTION;  Surgeon: Catalina AntiguaPeggy Constant, MD;  [facility];  Service: Obstetrics;  Laterality: N/A;   Social History   Tobacco Use  . Smoking status: Current Every Day Smoker    Packs/day: 0.50    Types: Cigarettes  . Smokeless tobacco: Never Used  Substance Use Topics  . Alcohol use: No    Alcohol/week: 0.0 oz   family history includes Celiac disease in her mother; Heart disease in her maternal grandfather; Hypertension in her mother.    ROS: negative except as noted in the HPI  Medications: Current Outpatient Medications  Medication Sig Dispense Refill  . ciprofloxacin (CIPRO) 500 MG tablet Take 1 tablet (500 mg total) by mouth 2 (two) times daily for 7 days. 14 tablet 0  . desogestrel-ethinyl estradiol (KARIVA,AZURETTE,MIRCETTE) 0.15-0.02/0.01 MG (21/5) tablet Take 1 tablet by mouth daily. 3 Package 4   No current facility-administered medications for this visit.    No Known Allergies     Objective:  BP 117/67   Pulse 79   Temp 98.8 F (37.1 C) (Oral)  Gen:  alert, not ill-appearing, no distress, appropriate for age HEENT: head  normocephalic without obvious abnormality, conjunctiva and cornea clear, trachea midline Pulm: Normal work of breathing, normal phonation GI: abdomen soft, non-tender, no CVA tenderness Neuro: alert and oriented x 3, no tremor MSK: extremities atraumatic, normal gait and station Skin: intact, no rashes on exposed skin, no jaundice, no cyanosis   Results for orders placed or performed in visit on 12/01/17 (from the past 72 hour(s))  POCT Urinalysis Dipstick     Status: None   Collection Time: 12/01/17  3:06 PM  Result Value Ref Range   Color, UA yellow    Clarity, UA clear    Glucose, UA neg    Bilirubin, UA neg    Ketones, UA neg    Spec Grav, UA 1.020 1.010 - 1.025   Blood, UA small    pH, UA 7.5 5.0 - 8.0   Protein, UA neg    Urobilinogen, UA 0.2 0.2 or 1.0 E.U./dL   Nitrite, UA neg    Leukocytes, UA Negative Negative   Appearance     Odor    Urine Culture     Status: None   Collection Time: 12/01/17  3:06 PM  Result Value Ref Range  MICRO NUMBER: [id number]    SPECIMEN QUALITY: ADEQUATE    Sample Source NOT GIVEN    STATUS: FINAL    Result: No Growth    US Pelvis Transvanginal Non-ob (tv Only)  Result Date: 12/01/2017 CLINICAL DATA:  Right lower quadrant fullness, post coital bleeding EXAM: TRANSABDOMINAL AND TRANSVAGINAL ULTRASOUND OF PELVIS TECHNIQUE: Both transabdominal and transvaginal ultrasound examinations of the pelvis were performed. Transabdominal technique was performed for global imaging of the pelvis including uterus, ovaries, adnexal regions, and pelvic cul-de-sac. It was necessary to proceed with endovaginal exam following the transabdominal exam to visualize the uterus, endometrium, ovaries and adnexa. COMPARISON:  CT 04/26/2017 FINDINGS: Uterus Measurements: 6.8 x 3.4 x 4.3 cm. No fibroids or other mass visualized. Endometrium Thickness: 2 mm in thickness.  No focal abnormality visualized. Right ovary Measurements: 3.3 x 2.0 x 2.7 cm. Normal appearance/no  adnexal mass. Left ovary Measurements: 3.9 x 2.6 x 2.5 cm. Normal appearance/no adnexal mass. Other findings No abnormal free fluid. IMPRESSION: Unremarkable pelvic ultrasound. Electronically Signed   By: Charlett Nose M.D.   On: 12/01/2017 10:36   US Pelvis (transabdominal Only)  Result Date: 12/01/2017 CLINICAL DATA:  Right lower quadrant fullness, post coital bleeding EXAM: TRANSABDOMINAL AND TRANSVAGINAL ULTRASOUND OF PELVIS TECHNIQUE: Both transabdominal and transvaginal ultrasound examinations of the pelvis were performed. Transabdominal technique was performed for global imaging of the pelvis including uterus, ovaries, adnexal regions, and pelvic cul-de-sac. It was necessary to proceed with endovaginal exam following the transabdominal exam to visualize the uterus, endometrium, ovaries and adnexa. COMPARISON:  CT 04/26/2017 FINDINGS: Uterus Measurements: 6.8 x 3.4 x 4.3 cm. No fibroids or other mass visualized. Endometrium Thickness: 2 mm in thickness.  No focal abnormality visualized. Right ovary Measurements: 3.3 x 2.0 x 2.7 cm. Normal appearance/no adnexal mass. Left ovary Measurements: 3.9 x 2.6 x 2.5 cm. Normal appearance/no adnexal mass. Other findings No abnormal free fluid. IMPRESSION: Unremarkable pelvic ultrasound. Electronically Signed   By: Charlett Nose M.D.   On: 12/01/2017 10:36      Assessment and Plan: 22 y.o. female with   1. Acute cystitis with hematuria - POCT Urinalysis Dipstick positive for trace blood. Urine pregnancy negative - given history of nephrolithiasis and that office will be closed for the weekend preventing close follow-up, I am going to treat her empirically for pyelonephritis. She has no evidence of pyelo on exam today, afebrile, no nausea/vomiting/renal colic, no CVA tenderness.  - stop Macrobid and switch to Cipro. Rocephin 1 g Im given in office today - ciprofloxacin (CIPRO) 500 MG tablet; Take 1 tablet (500 mg total) by mouth 2 (two) times daily for 7  days.  Dispense: 14 tablet; Refill: 0 - Urine Culture - cefTRIAXone (ROCEPHIN) injection 1 g  2. Acute right flank pain - Urine Culture - cefTRIAXone (ROCEPHIN) injection 1 g  3. History of nephrolithiasis - CT 04/2017, left renal calculi   Patient education and anticipatory guidance given Patient agrees with treatment plan Follow-up in 4 days or sooner as needed if symptoms worsen or fail to improve  Levonne Hubert PA-C

## 2017-12-10 ENCOUNTER — Encounter: Payer: Self-pay | Admitting: Physician Assistant

## 2017-12-10 DIAGNOSIS — R1903 Right lower quadrant abdominal swelling, mass and lump: Secondary | ICD-10-CM | POA: Insufficient documentation

## 2017-12-10 DIAGNOSIS — N939 Abnormal uterine and vaginal bleeding, unspecified: Secondary | ICD-10-CM | POA: Insufficient documentation

## 2017-12-10 DIAGNOSIS — Z30011 Encounter for initial prescription of contraceptive pills: Secondary | ICD-10-CM | POA: Insufficient documentation

## 2017-12-11 ENCOUNTER — Ambulatory Visit: Payer: BLUE CROSS/BLUE SHIELD

## 2017-12-24 ENCOUNTER — Other Ambulatory Visit: Payer: Self-pay | Admitting: Physician Assistant

## 2017-12-24 DIAGNOSIS — Z30011 Encounter for initial prescription of contraceptive pills: Secondary | ICD-10-CM

## 2017-12-25 ENCOUNTER — Encounter: Payer: Self-pay | Admitting: Physician Assistant

## 2017-12-25 ENCOUNTER — Other Ambulatory Visit: Payer: Self-pay | Admitting: Physician Assistant

## 2017-12-25 DIAGNOSIS — Z87442 Personal history of urinary calculi: Secondary | ICD-10-CM

## 2017-12-28 ENCOUNTER — Other Ambulatory Visit: Payer: Self-pay | Admitting: Physician Assistant

## 2017-12-28 DIAGNOSIS — Z30011 Encounter for initial prescription of contraceptive pills: Secondary | ICD-10-CM

## 2018-01-14 ENCOUNTER — Other Ambulatory Visit: Payer: Self-pay | Admitting: Physician Assistant

## 2018-01-14 DIAGNOSIS — Z30011 Encounter for initial prescription of contraceptive pills: Secondary | ICD-10-CM

## 2018-02-26 ENCOUNTER — Ambulatory Visit: Payer: BLUE CROSS/BLUE SHIELD | Admitting: Physician Assistant

## 2018-06-14 ENCOUNTER — Encounter: Payer: Self-pay | Admitting: Physician Assistant

## 2018-06-19 ENCOUNTER — Ambulatory Visit (INDEPENDENT_AMBULATORY_CARE_PROVIDER_SITE_OTHER): Payer: BLUE CROSS/BLUE SHIELD | Admitting: Physician Assistant

## 2018-06-19 ENCOUNTER — Encounter: Payer: Self-pay | Admitting: Physician Assistant

## 2018-06-19 VITALS — BP 129/88 | HR 85 | Wt 119.0 lb

## 2018-06-19 DIAGNOSIS — Z30011 Encounter for initial prescription of contraceptive pills: Secondary | ICD-10-CM | POA: Diagnosis not present

## 2018-06-19 DIAGNOSIS — R350 Frequency of micturition: Secondary | ICD-10-CM

## 2018-06-19 DIAGNOSIS — Z113 Encounter for screening for infections with a predominantly sexual mode of transmission: Secondary | ICD-10-CM

## 2018-06-19 LAB — POCT URINALYSIS DIPSTICK
Bilirubin, UA: NEGATIVE
Glucose, UA: NEGATIVE
KETONES UA: NEGATIVE
LEUKOCYTES UA: NEGATIVE
NITRITE UA: NEGATIVE
PH UA: 6 (ref 5.0–8.0)
PROTEIN UA: NEGATIVE
RBC UA: NEGATIVE
Spec Grav, UA: 1.015 (ref 1.010–1.025)
UROBILINOGEN UA: 0.2 U/dL

## 2018-06-19 LAB — POCT URINE PREGNANCY: PREG TEST UR: NEGATIVE

## 2018-06-19 MED ORDER — DESOGESTREL-ETHINYL ESTRADIOL 0.15-0.02/0.01 MG (21/5) PO TABS: 1.0000 | ORAL_TABLET | Freq: Every day | ORAL | 4 refills | Status: DC

## 2018-06-19 NOTE — Progress Notes (Signed)
HPI:                                                                Cynthia Carroll is a 22 y.o. female who presents to [name] today for UTI symptoms  Urinary Tract Infection   This is a new problem. The current episode started in the past 7 days. The problem occurs intermittently. The problem has been gradually improving. The quality of the pain is described as burning. The pain is mild. There has been no fever. She is sexually active. There is no history of pyelonephritis. Associated symptoms include frequency, a possible pregnancy and urgency. Pertinent negatives include no chills, discharge, flank pain, nausea, sweats or vomiting. She has tried increased fluids for the symptoms. The treatment provided significant relief.     Past Medical History:  Diagnosis Date  . Anxiety   . Bipolar disorder (HCC)   . Depression   . Eclampsia affecting pregnancy    Past Surgical History:  Procedure Laterality Date  . CESAREAN SECTION N/A 04/02/2016   Procedure: CESAREAN SECTION;  Surgeon: Catalina Antigua, MD;  Location: [facility] SUIT[facility];  Service: Obstetrics;  Laterality: N/A;   Social History   Tobacco Use  . Smoking status: Current Every Day Smoker    Packs/day: 0.50    Types: Cigarettes  . Smokeless tobacco: Never Used  Substance Use Topics  . Alcohol use: No    Alcohol/week: 0.0 standard drinks   family history includes Celiac disease in her mother; Heart disease in her maternal grandfather; Hypertension in her mother.    ROS: negative except as noted in the HPI  Medications: Current Outpatient Medications  Medication Sig Dispense Refill  . desogestrel-ethinyl estradiol (KARIVA,AZURETTE,MIRCETTE) 0.15-0.02/0.01 MG (21/5) tablet Take 1 tablet by mouth daily. 3 Package 4   No current facility-administered medications for this visit.    No Known Allergies     Objective:  BP 129/88   Pulse 85   Wt 119 lb (54 kg)    LMP 05/11/2018 (Approximate)   BMI 20.43 kg/m  Gen:  alert, not ill-appearing, no distress, appropriate for age HEENT: head normocephalic without obvious abnormality, conjunctiva and cornea clear, trachea midline Pulm: Normal work of breathing, normal phonatio Neuro: alert and oriented x 3, no tremor MSK: extremities atraumatic, normal gait and station Skin: intact, no rashes on exposed skin, no jaundice, no cyanosis    Results for orders placed or performed in visit on 06/19/18 (from the past 72 hour(s))  POCT Urinalysis Dipstick     Status: Normal   Collection Time: 06/19/18  4:40 PM  Result Value Ref Range   Color, UA yellow    Clarity, UA clear    Glucose, UA Negative Negative   Bilirubin, UA negative    Ketones, UA negative    Spec Grav, UA 1.015 1.010 - 1.025   Blood, UA negative    pH, UA 6.0 5.0 - 8.0   Protein, UA Negative Negative   Urobilinogen, UA 0.2 0.2 or 1.0 E.U./dL   Nitrite, UA negative    Leukocytes, UA Negative Negative   Appearance     Odor    POCT urine pregnancy     Status: Normal   Collection Time: 06/19/18  4:45 PM  Result Value Ref Range   Preg Test, Ur Negative Negative   No results found.    Assessment and Plan: 22 y.o. female with   .[name] was seen today for urinary frequency.  Diagnoses and all orders for this visit:  Urinary frequency -     POCT Urinalysis Dipstick -     Urine Culture -     POCT urine pregnancy -     C. trachomatis/N. gonorrhoeae RNA  Encounter for initial prescription of contraceptive pills -     desogestrel-ethinyl estradiol (KARIVA,AZURETTE,MIRCETTE) 0.15-0.02/0.01 MG (21/5) tablet; Take 1 tablet by mouth daily.  Routine screening for STI (sexually transmitted infection) -     C. trachomatis/N. gonorrhoeae RNA   - UA negative in office today. Urine cx pending. Suspect symptoms are irritative due to excessive caffeine.  - patient self-discontinued her OCP. Urine preg is negative. She wishes to resume it.  Refill sent    Patient education and anticipatory guidance given Patient agrees with treatment plan Follow-up as needed if symptoms worsen or fail to improve  Levonne Hubert PA-C

## 2018-06-19 NOTE — Patient Instructions (Signed)

## 2018-06-20 ENCOUNTER — Encounter: Payer: Self-pay | Admitting: Physician Assistant

## 2018-06-20 DIAGNOSIS — R350 Frequency of micturition: Secondary | ICD-10-CM | POA: Insufficient documentation

## 2018-06-20 LAB — C. TRACHOMATIS/N. GONORRHOEAE RNA
C. trachomatis RNA, TMA: NOT DETECTED
N. GONORRHOEAE RNA, TMA: NOT DETECTED

## 2018-06-20 LAB — URINE CULTURE
MICRO NUMBER:: 91083899
RESULT: NO GROWTH
SPECIMEN QUALITY:: ADEQUATE

## 2018-07-02 ENCOUNTER — Encounter: Payer: Self-pay | Admitting: Physician Assistant

## 2018-07-04 MED ORDER — NORETHINDRONE ACET-ETHINYL EST 1-20 MG-MCG PO TABS: 1.0000 | ORAL_TABLET | Freq: Every day | ORAL | 4 refills | Status: DC

## 2018-07-26 IMAGING — CT CT RENAL STONE PROTOCOL
2 of 5 series · 15 of 46 positions shown, 17 images · non-contrast
Comparison: None.

CLINICAL DATA: 21-year-old female with right flank pain.

EXAM:
CT ABDOMEN AND PELVIS WITHOUT CONTRAST
TECHNIQUE: Multidetector CT imaging of the abdomen and pelvis was performed
following the standard protocol without IV contrast.

[Series 2: axial st · axial · 0.70mm/px · z∈[-496,-91]mm · 12 of 97 slices shown, 14 images]
[im 8/97  soft-tissue]
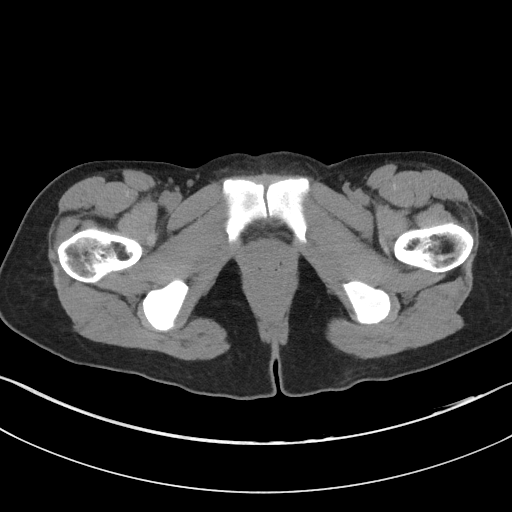
[im 8/97  bone]
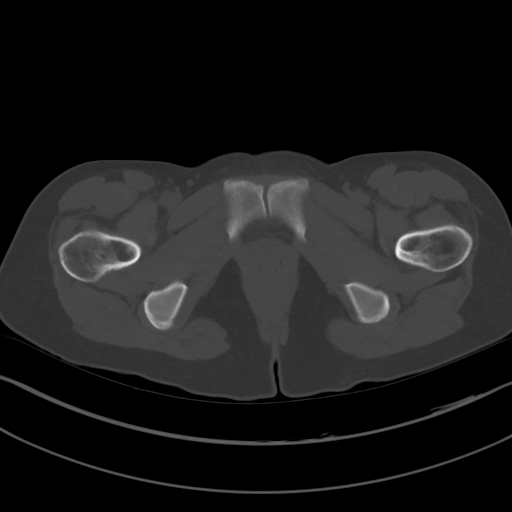
[im 15/97  soft-tissue]
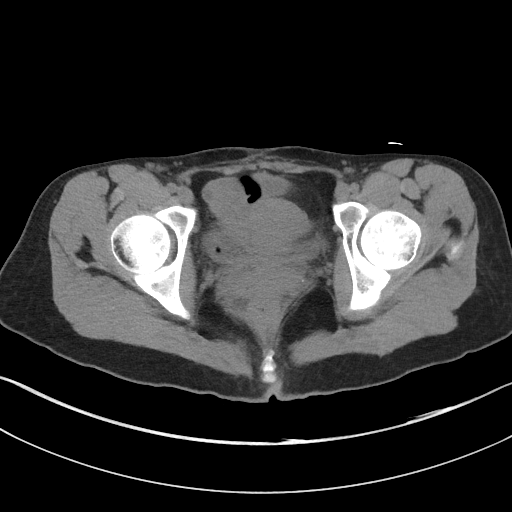
[im 23/97  soft-tissue]
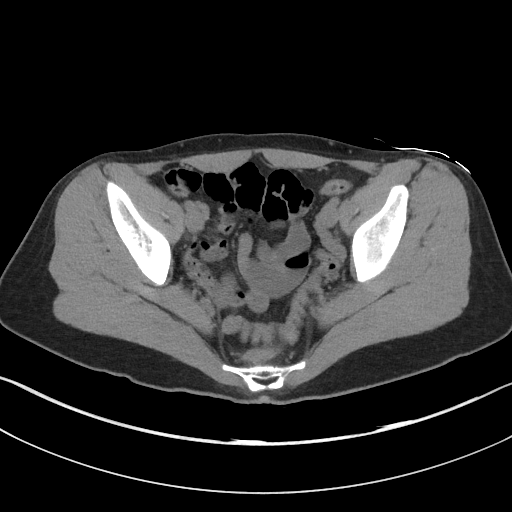
[im 30/97  soft-tissue]
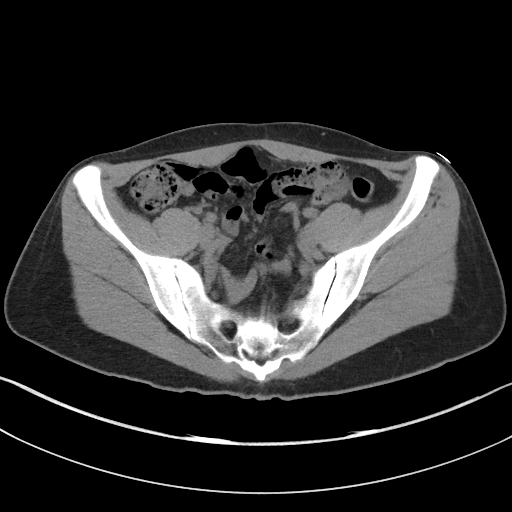
[im 37/97  soft-tissue]
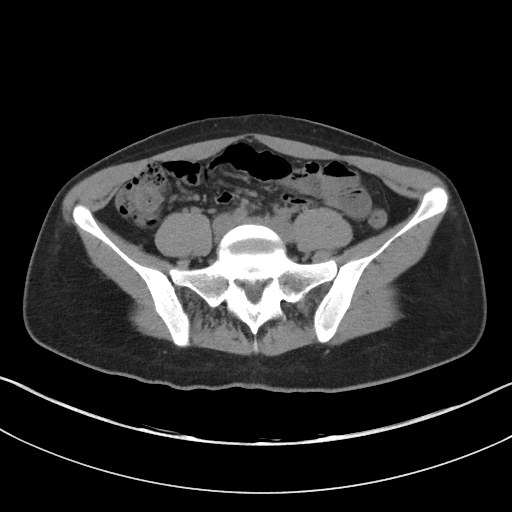
[im 45/97  soft-tissue]
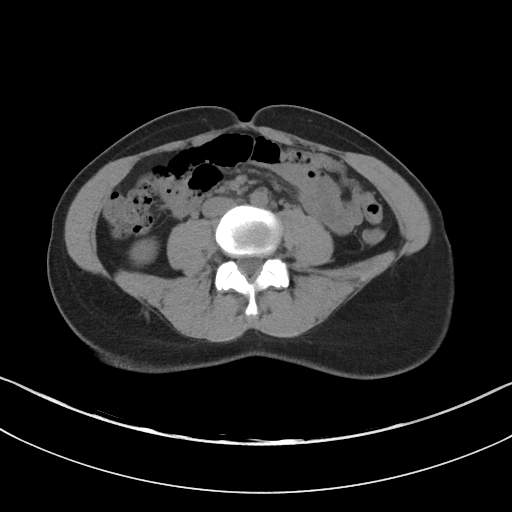
[im 52/97  soft-tissue]
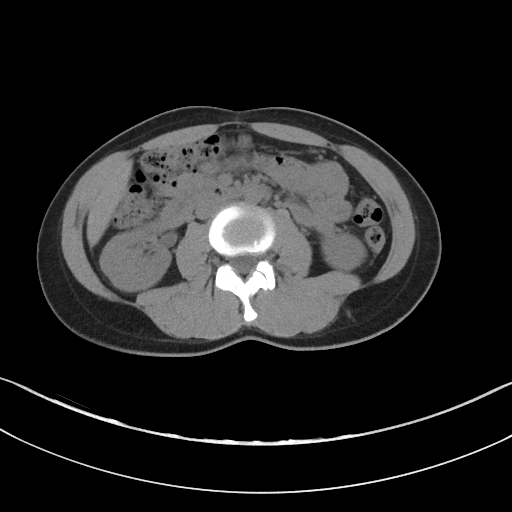
[im 60/97  soft-tissue]
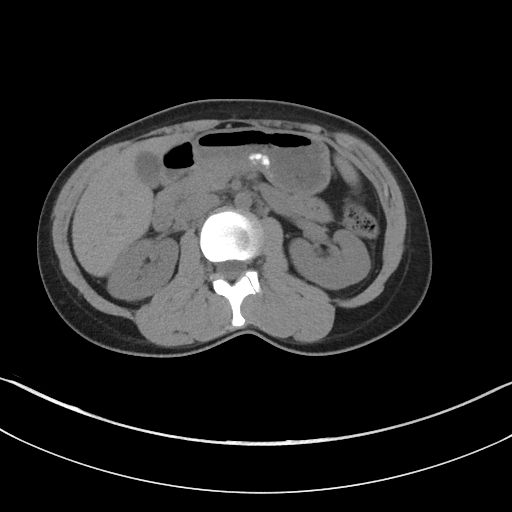
[im 67/97  soft-tissue]
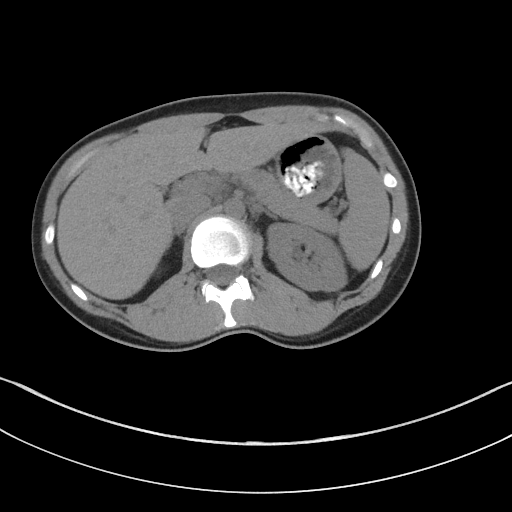
[im 67/97  bone]
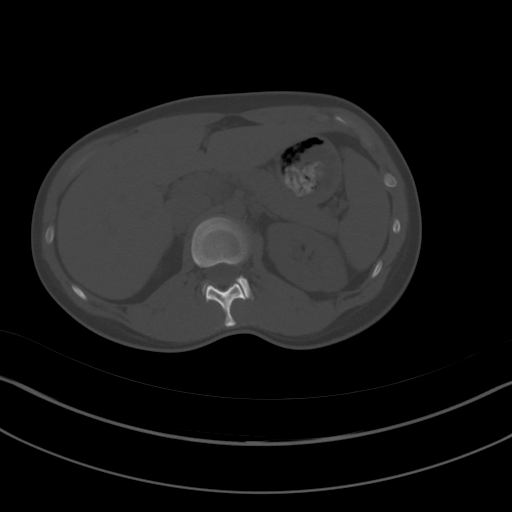
[im 74/97  soft-tissue]
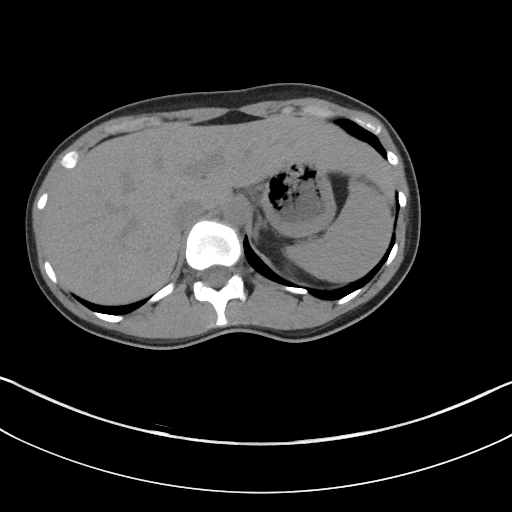
[im 82/97  soft-tissue]
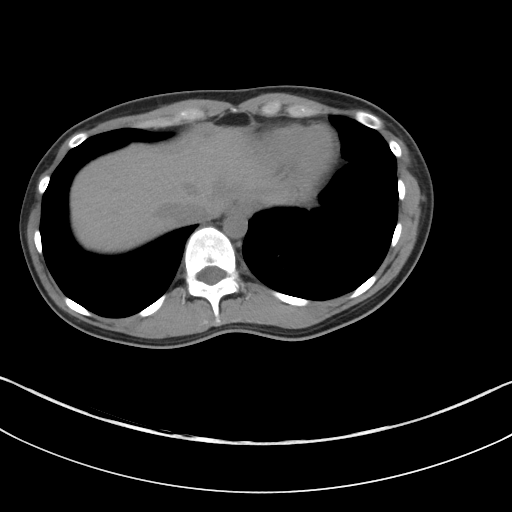
[im 89/97  soft-tissue]
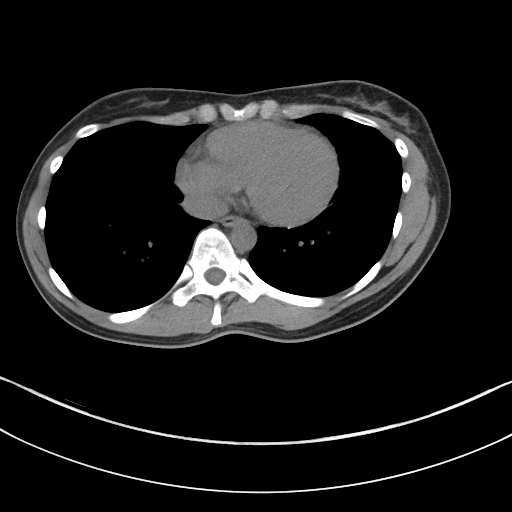

[Series 4: coronal st · coronal · 0.79mm/px · 3 of 65 slices shown]
[im 22/65  soft-tissue]
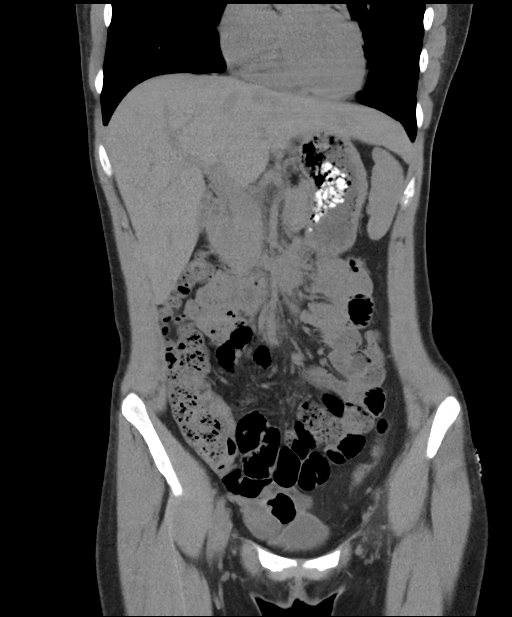
[im 29/65  soft-tissue]
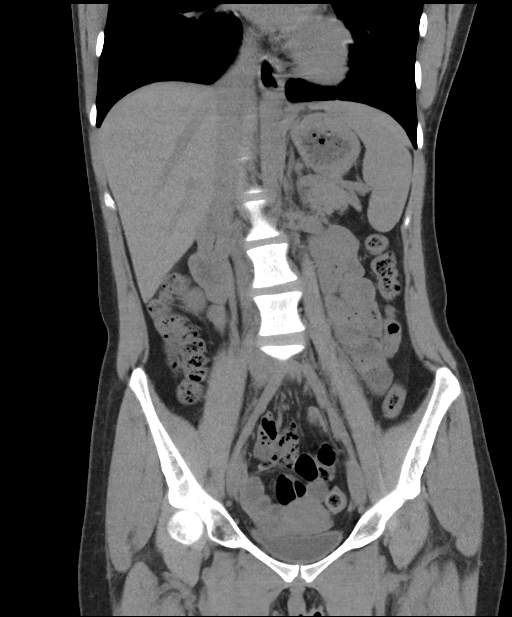
[im 36/65  soft-tissue]
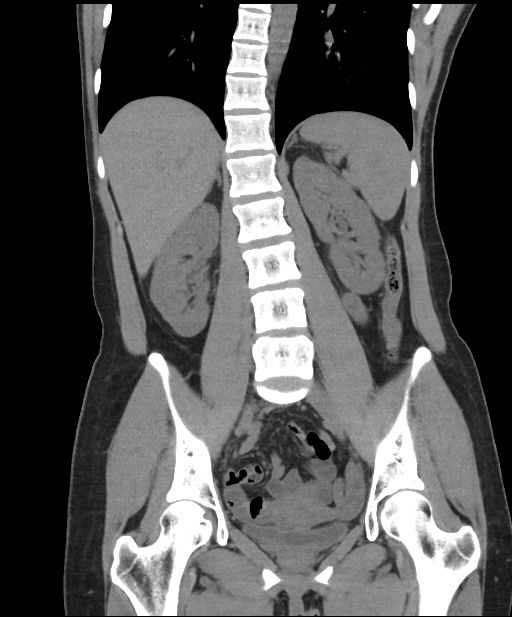

[15 of 46 positions shown; findings below may reference images not displayed]

FINDINGS: Evaluation of this exam is limited in the absence of intravenous
contrast.

Lower chest: The visualized lung bases are clear.

No intra-abdominal free air or free fluid.

Hepatobiliary: No focal liver abnormality is seen. No gallstones,
gallbladder wall thickening, or biliary dilatation.

Pancreas: Unremarkable. No pancreatic ductal dilatation or
surrounding inflammatory changes.

Spleen: Normal in size without focal abnormality.

Adrenals/Urinary Tract: There are punctate nonobstructing left renal
inferior pole calculi. No hydronephrosis. The right kidney is
unremarkable. The visualized ureters and urinary bladder appear
unremarkable.

Stomach/Bowel: There is moderate stool throughout the colon. No
bowel obstruction or active inflammation. Normal appendix.

Vascular/Lymphatic: The abdominal aorta and IVC are grossly
unremarkable on this noncontrast study. No portal venous gas
identified. There is no adenopathy.

Reproductive: The uterus is anteverted and grossly unremarkable. The
ovaries appear unremarkable as well. No pelvic mass.

Other: Diastases of anterior abdominal wall musculature with a small
fat containing umbilical hernia.

Musculoskeletal: No acute or significant osseous findings.
IMPRESSION: 1. Punctate nonobstructing left renal calculi. No hydronephrosis.
There is no hydronephrosis or nephrolithiasis on the right.
2. No bowel obstruction or active inflammation.

## 2018-12-24 ENCOUNTER — Ambulatory Visit (INDEPENDENT_AMBULATORY_CARE_PROVIDER_SITE_OTHER): Payer: BLUE CROSS/BLUE SHIELD | Admitting: Physician Assistant

## 2018-12-24 ENCOUNTER — Other Ambulatory Visit: Payer: Self-pay

## 2018-12-24 ENCOUNTER — Encounter: Payer: Self-pay | Admitting: Physician Assistant

## 2018-12-24 VITALS — BP 132/83 | HR 86 | Temp 98.4°F | Ht 63.0 in | Wt 119.0 lb

## 2018-12-24 DIAGNOSIS — J029 Acute pharyngitis, unspecified: Secondary | ICD-10-CM

## 2018-12-24 LAB — POCT RAPID STREP A (OFFICE): Rapid Strep A Screen: NEGATIVE

## 2018-12-24 NOTE — Progress Notes (Signed)
Subjective:    Patient ID: Cynthia Carroll, female    DOB: 18-Feb-1996, 23 y.o.   MRN: [id number]  HPI  Patient is a 23 year old female who presents to the clinic with a sore throat for 2 to 3 days and a fever that started this morning.  She denies any ear pain, sinus pressure, nasal discharge, shortness of breath, or cough.  She denies any recent travel or contact with someone who has traveled.  She is taking Tylenol and ibuprofen today with some relief.  .. Active Ambulatory Problems    Diagnosis Date Noted  . Mild hypertension 05/16/2016  . Abscess of skin 11/29/2016  . History of eclampsia 12/01/2016  . Tobacco use disorder 12/01/2016  . Postcoital bleeding 12/01/2017  . Bipolar disorder (HCC) 04/01/2014  . History of nephrolithiasis 12/04/2017  . Acute right flank pain 12/04/2017  . Encounter for initial prescription of contraceptive pills 12/10/2017  . Abdominal right lower quadrant swelling 12/10/2017  . Abnormal uterine bleeding (AUB) 12/10/2017  . Urinary frequency 06/20/2018   Resolved Ambulatory Problems    Diagnosis Date Noted  . Supervision of normal first pregnancy, antepartum 10/12/2015  . Chlamydia infection affecting pregnancy in first trimester, antepartum 10/26/2015  . UTI (urinary tract infection) in pregnancy in first trimester 10/26/2015  . Mild preeclampsia 03/28/2016  . Preeclampsia, severe 03/29/2016  . Eclampsia, delivered 04/02/2016   Past Medical History:  Diagnosis Date  . Anxiety   . Depression   . Eclampsia affecting pregnancy        Review of Systems    see HPI>  Objective:   Physical Exam Vitals signs reviewed.  Constitutional:      Appearance: Normal appearance.  HENT:     Head: Normocephalic.     Right Ear: Tympanic membrane and ear canal normal.     Left Ear: Tympanic membrane and ear canal normal.     Nose: Nose normal. No congestion.     Mouth/Throat:     Mouth: Mucous membranes are moist.     Pharynx: Posterior  oropharyngeal erythema present. No oropharyngeal exudate.  Eyes:     Conjunctiva/sclera: Conjunctivae normal.  Neck:     Comments: Non tender adenopathy.  Cardiovascular:     Rate and Rhythm: Normal rate and regular rhythm.     Pulses: Normal pulses.  Pulmonary:     Effort: Pulmonary effort is normal.     Breath sounds: Normal breath sounds.  Lymphadenopathy:     Cervical: Cervical adenopathy present.  Neurological:     General: No focal deficit present.     Mental Status: She is alert and oriented to person, place, and time.  Psychiatric:        Mood and Affect: Mood normal.           Assessment & Plan:  Marland KitchenMarland KitchenLeala was seen today for follow-up.  Diagnoses and all orders for this visit:  Acute pharyngitis, unspecified etiology  Sore throat -     POCT rapid strep A   .. Results for orders placed or performed in visit on 12/24/18  POCT rapid strep A  Result Value Ref Range   Rapid Strep A Screen Negative Negative   Reassuring that rapid strep was negative.  Likely this is viral pharyngitis.  Discussed symptomatic care with patient.  Certainly there is a chance of a false negative.  If she continues to run a fever, develops tender adenopathy, develops exudate on the tonsils please call office and we could consider treating  for strep throat.  Per patient she had a fever this morning she should not return to work until she is fever free for 24 hours.  Vital signs are reassuring today.

## 2018-12-24 NOTE — Patient Instructions (Signed)
Pharyngitis  Pharyngitis is a sore throat (pharynx). This is when there is redness, pain, and swelling in your throat. Most of the time, this condition gets better on its own. In some cases, you may need medicine. Follow these instructions at home:  Take over-the-counter and prescription medicines only as told by your doctor. ? If you were prescribed an antibiotic medicine, take it as told by your doctor. Do not stop taking the antibiotic even if you start to feel better. ? Do not give children aspirin. Aspirin has been linked to Reye syndrome.  Drink enough water and fluids to keep your pee (urine) clear or pale yellow.  Get a lot of rest.  Rinse your mouth (gargle) with a salt-water mixture 3-4 times a day or as needed. To make a salt-water mixture, completely dissolve -1 tsp of salt in 1 cup of warm water.  If your doctor approves, you may use throat lozenges or sprays to soothe your throat. Contact a doctor if:  You have large, tender lumps in your neck.  You have a rash.  You cough up green, yellow-brown, or bloody spit. Get help right away if:  You have a stiff neck.  You drool or cannot swallow liquids.  You cannot drink or take medicines without throwing up.  You have very bad pain that does not go away with medicine.  You have problems breathing, and it is not from a stuffy nose.  You have new pain and swelling in your knees, ankles, wrists, or elbows. Summary  Pharyngitis is a sore throat (pharynx). This is when there is redness, pain, and swelling in your throat.  If you were prescribed an antibiotic medicine, take it as told by your doctor. Do not stop taking the antibiotic even if you start to feel better.  Most of the time, pharyngitis gets better on its own. Sometimes, you may need medicine. This information is not intended to replace advice given to you by your health care provider. Make sure you discuss any questions you have with your health care  provider. Document Released: 03/14/2008 Document Revised: 11/01/2016 Document Reviewed: 11/01/2016 Elsevier Interactive Patient Education  2019 Elsevier Inc.  

## 2018-12-25 ENCOUNTER — Encounter: Payer: Self-pay | Admitting: Physician Assistant

## 2019-04-01 ENCOUNTER — Encounter: Payer: BC Managed Care – PPO | Admitting: Physician Assistant

## 2019-04-05 ENCOUNTER — Encounter: Payer: BC Managed Care – PPO | Admitting: Physician Assistant

## 2019-04-30 ENCOUNTER — Telehealth: Payer: BC Managed Care – PPO | Admitting: Family

## 2019-04-30 DIAGNOSIS — L559 Sunburn, unspecified: Secondary | ICD-10-CM

## 2019-04-30 NOTE — Progress Notes (Signed)
We are sorry that you are not feeling well.  Here is how we plan to help!  Based on what you have shared with me, I'd like to share with you a treatment plan for sunburn.   Most sunburn is a first degree burn that turns the skin pink or red.  It can be painful to touch.  If you stayed in the sun for a prolonged period this might have progressed to a second degree burn with blistering!  Usually the pain and swelling starts after about 4 hours, peaks at 24 hours and begins to improve after 48 hours or about 2 days.  REMEMBER prolonged exposure to the sun increases your risk of skin cancer so use sunscreen before you go outside!  We will give you more information about sunscreen use later in your care plan.  Your sunburn can be managed by self-care at home.  Please use the following care guide to manage your sunburn.  If you symptoms worsen, you have other questions or concerns, or you develop any of the warnings signs listed in your care plan you will need to seek a face to face visit with a provider without waiting!  Home Care Advice for Treating Mild Sunburn:  1. Take Ibuprofen (Advil, Motrin) for pain relief as soon as possible.  The adult dosage is up to 600 mg every 6 hours.  Starting within 6 hours of sun exposure may greatly reduce your discomfort.  If you cannot take Ibuprofen you may use Acetaminophen instead.  Do not take Ibuprofen if you have stomach problems, kidney disease or are pregnant.  Do not take Ibuprofen if you have been told by your doctor or pharmacist to avoid this class of drugs.  Do not take Acetaminophen if you have liver disease.  Read the package warnings on any medication that you take!  2.  Use a steroid cream on the affected skin.  If you apply an over the counter steroid       cream as soon as possible and repeat it three times a day it may reduce the pain and      and swelling.  Until you get the steroid cream you may start with a moistening cream      cream  or aloe gel.  3. For second or third degree sunburn with painful blistering, you can use an over the         counter product Burn Jel Plus Pain Relieving Gel. Apply in a thick even layer over the     affected area not more than 3 to 4 times daily If you need to cover the area to protect      it from friction of clothing, you can use Moist Burn Pads such as Hydrogel Burn Pads       which are available over the counter.  4.  Apply cool compresses to the burned areas several times a day.  5.  Avoid soap on the sunburned areas.  6.  Drink plenty of water.  It is easy to get dehydrated from prolong time in the sun      Outdoors.  7.  For any broken blisters:  Trim off the dead skin with fine scissors.  It is wise to clean the scissor with alcohol before use.  Apply antibiotic ointments to the blister.  Apply twice a day for three days.  There are triple antibiotic ointments with topical pain relievers available at stores.  Caution:leave intact blisters alone.  They are protecting the skin and will allow it to heal.  8.  Taking Vitamin C orally may reduce sun damage to your skin.  Follow the      Instructions on the bottle.  The recommended adult dosage is 2 grams.  What to Expect:  1. Pain usually stops after 2 or 3 days. 2. Skin flaking and peeling usually occurs for 3-7 days after a sunburn.  Call your provider if:  1. You feel very weak or have difficulty standing. 2. Blister develops on your face. 3. You become sensitive to light because of eye pain. 4. Your skin looks infected (red streaks, puss or worsening tenderness after 48 hours. 5. You feel you should be seen.  Preventing Sunburns:  1. Apply 20-30 SPF sunscreen to your skin before going into the sun. 2. Reapply every 2-4 hours or after sweating or swimming. 3. Sunscreens protect from sunburns but do not completely prevent skin damage.  Sun exposure still increases your risk of premature aging and skin cancers.  Your  e-visit answers were reviewed by a board certified advanced clinical practitioner to complete your personal care plan.  Depending on the condition, your plan could have included both over the counter or prescription medications.  If there is a problem please reply  once you have received a response from your provider.  Your safety is important to us.  If you have drug allergies check your prescription carefully.    You can use MyChart to ask questions about today's visit, request a non-urgent call back, or ask for a work or school excuse for 24 hours related to this e-Visit. If it has been greater than 24 hours you will need to follow up with your provider, or enter a new e-Visit to address those concerns.  You will get an e-mail in the next two days asking about your experience.  I hope that your e-visit has been valuable and will speed your recovery. Thank you for using e-visits.   Greater than 5 minutes, yet less than 10 minutes of time have been spent researching, coordinating, and implementing care for this patient today.  Thank you for the details you included in the comment boxes. Those details are very helpful in determining the best course of treatment for you and help us to provide the best care.  

## 2019-05-18 IMAGING — US US PELVIS COMPLETE
1 series · 14 of 25 positions shown · non-contrast
Comparison: CT 04/26/2017

CLINICAL DATA: Right lower quadrant fullness, post coital bleeding



[Series 1: us pelvis complete · 0.17mm/px · 14 of 80 slices shown]
[im 1/80]
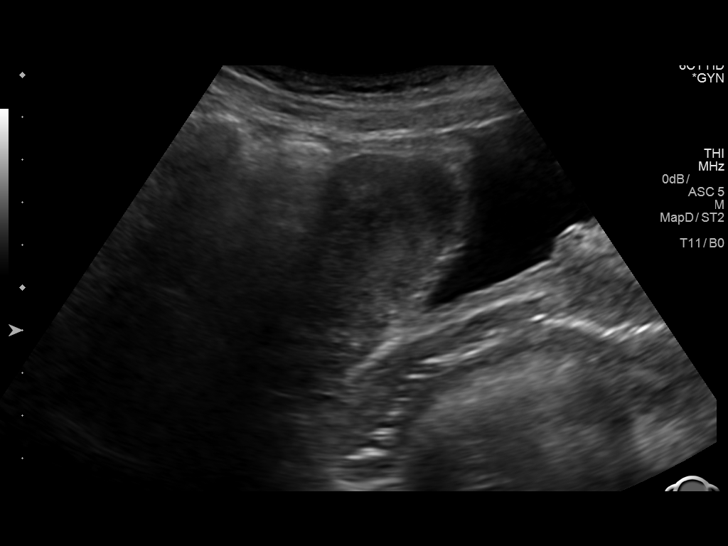
[im 7/80]
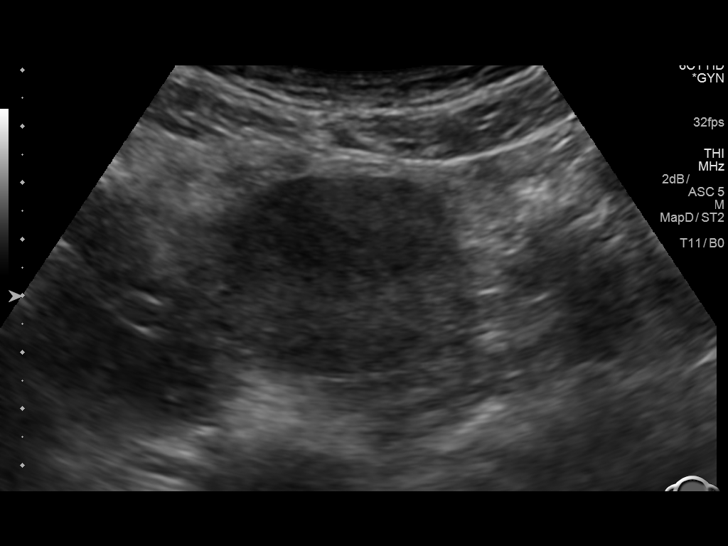
[im 14/80]
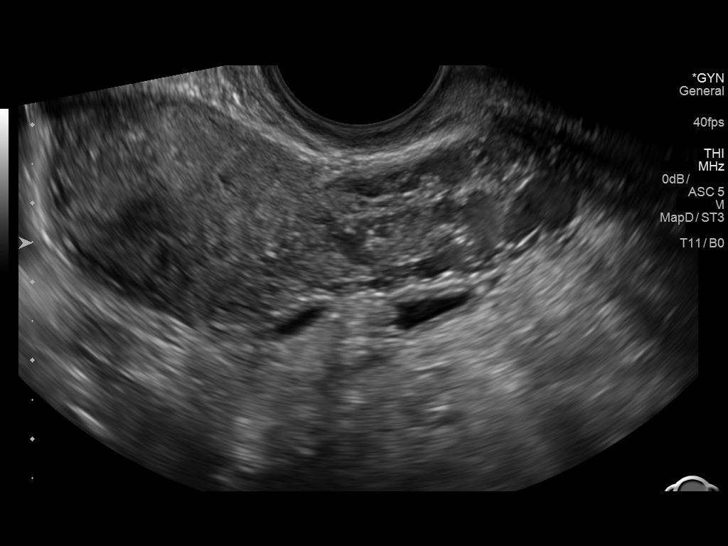
[im 20/80]
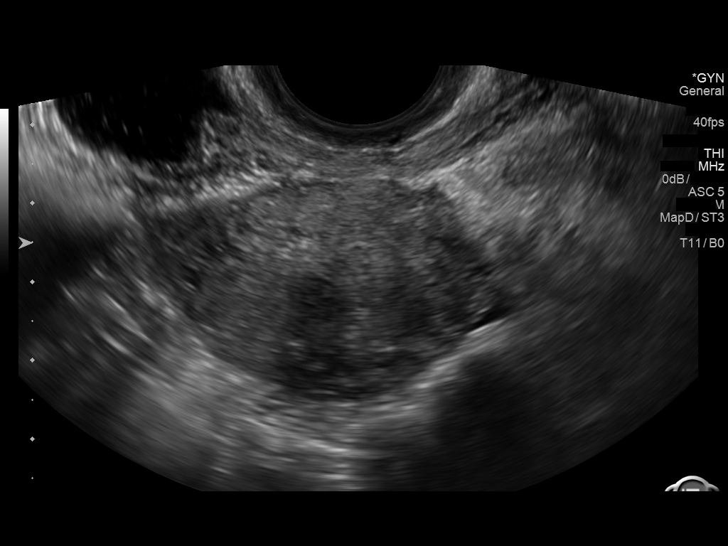
[im 27/80]
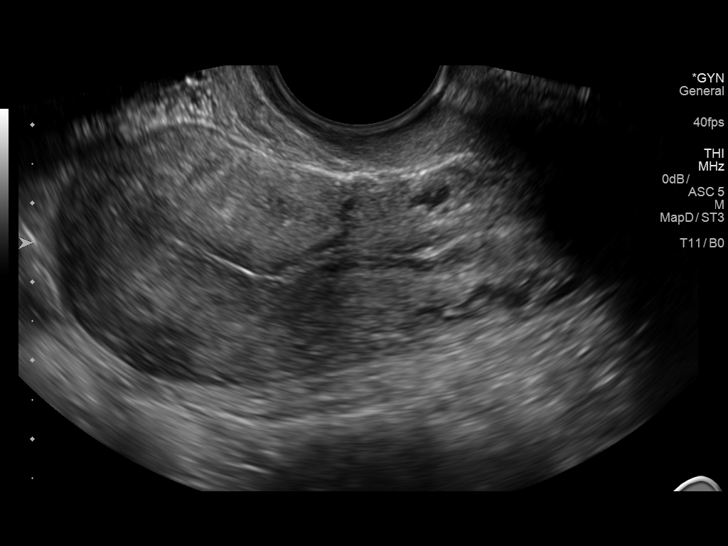
[im 30/80]
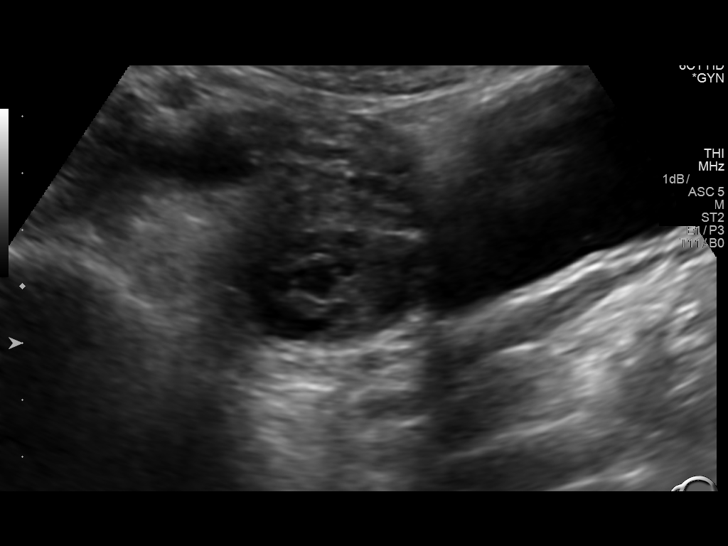
[im 37/80]
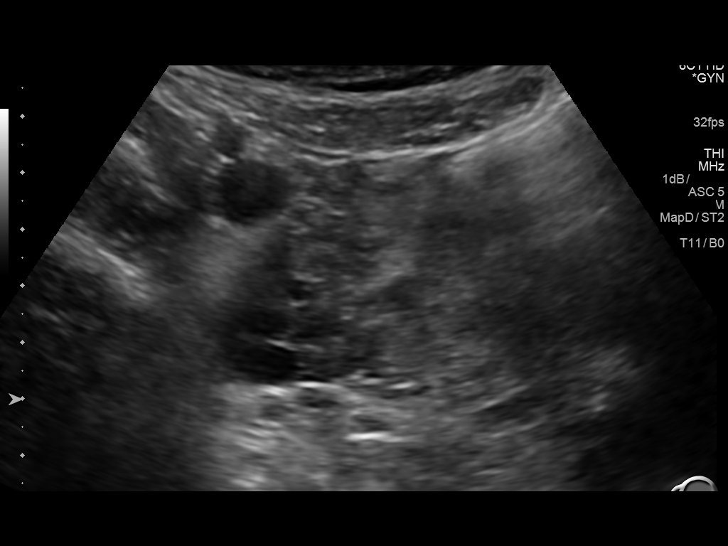
[im 43/80]
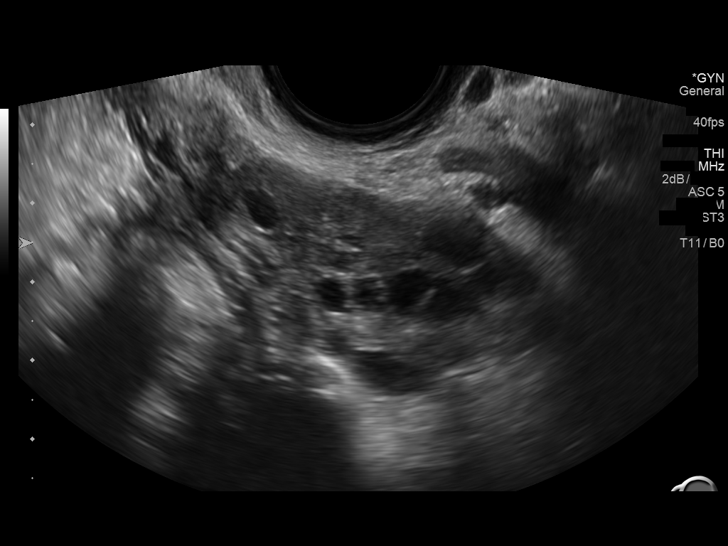
[im 50/80]
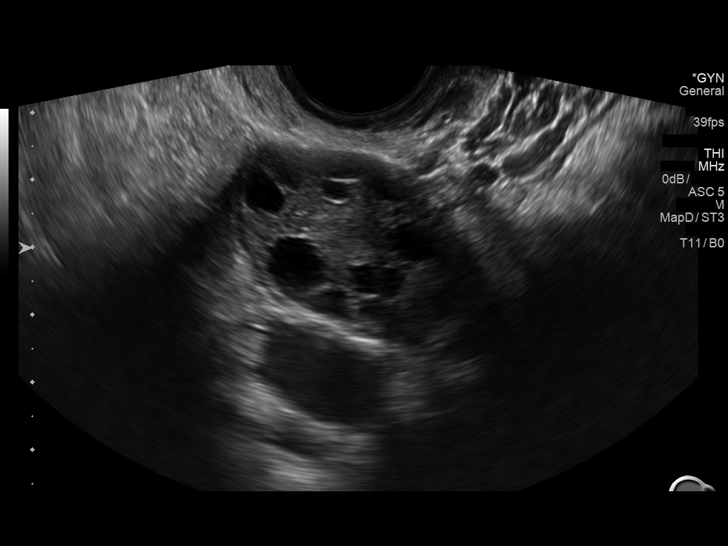
[im 53/80]
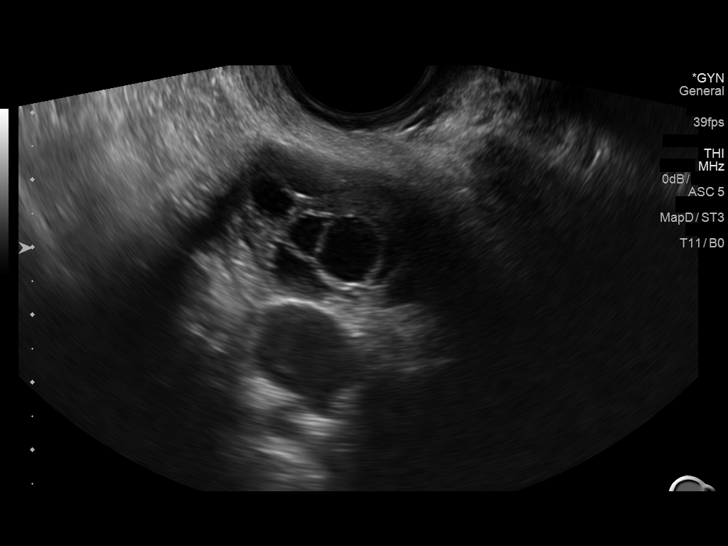
[im 60/80]
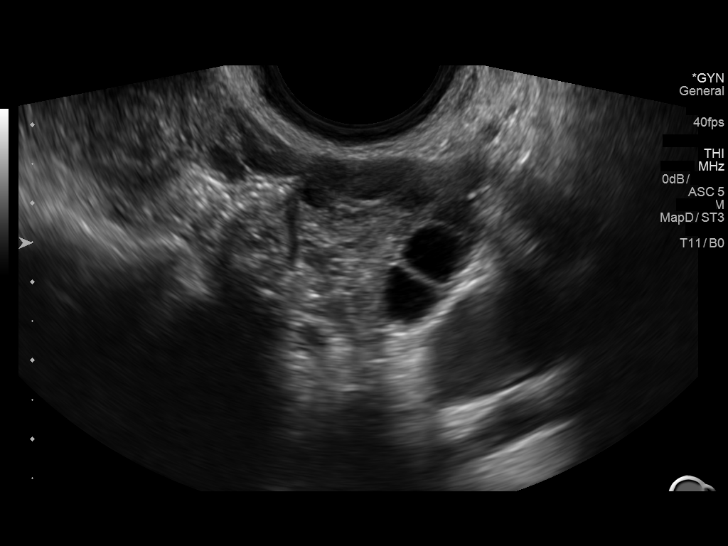
[im 66/80]
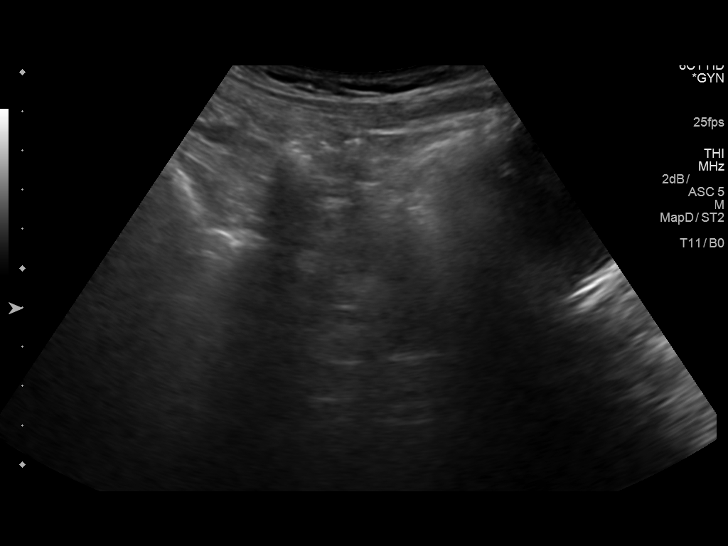
[im 73/80]
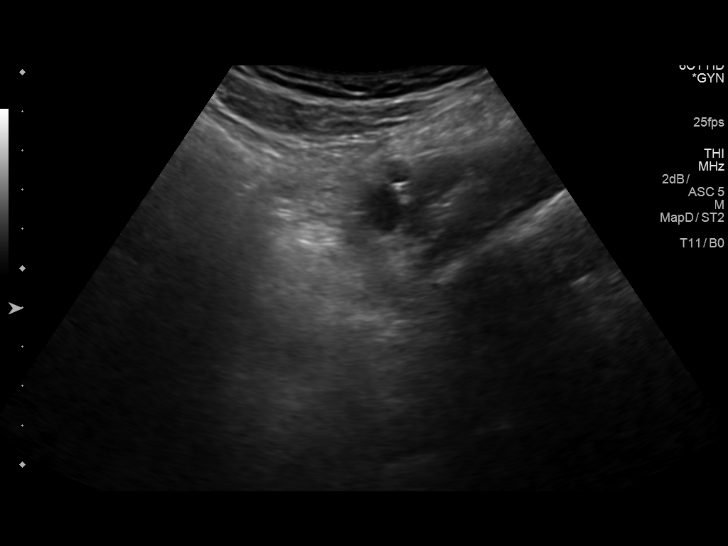
[im 80/80]
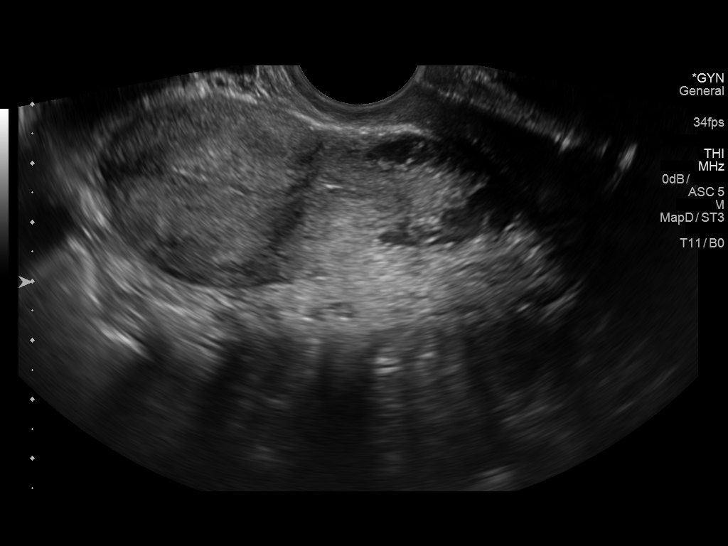

[14 of 25 positions shown; findings below may reference images not displayed]

FINDINGS: Uterus

Measurements: 6.8 x 3.4 x 4.3 cm. No fibroids or other mass
visualized.

Endometrium

Thickness: 2 mm in thickness.  No focal abnormality visualized.

Right ovary

Measurements: 3.3 x 2.0 x 2.7 cm. Normal appearance/no adnexal mass.

Left ovary

Measurements: 3.9 x 2.6 x 2.5 cm. Normal appearance/no adnexal mass.

Other findings

No abnormal free fluid.
IMPRESSION: Unremarkable pelvic ultrasound.

## 2019-09-26 ENCOUNTER — Encounter: Payer: Self-pay | Admitting: Family Medicine

## 2019-09-26 ENCOUNTER — Telehealth (INDEPENDENT_AMBULATORY_CARE_PROVIDER_SITE_OTHER): Payer: BC Managed Care – PPO | Admitting: Family Medicine

## 2019-09-26 DIAGNOSIS — R0789 Other chest pain: Secondary | ICD-10-CM

## 2019-09-26 DIAGNOSIS — F419 Anxiety disorder, unspecified: Secondary | ICD-10-CM

## 2019-09-26 NOTE — Progress Notes (Signed)
Virtual Visit via Video Note  I connected with Cynthia Carroll on 09/26/19 at  3:20 PM EST by a video enabled telemedicine application and verified that I am speaking with the correct person using two identifiers.   I discussed the limitations of evaluation and management by telemedicine and the availability of in person appointments. The patient expressed understanding and agreed to proceed.  Subjective:    CC: Atypical Chest Pain  HPI:  CP on the left side x 1 month. She stated that this has been off/on and it has happened about 4x this past month. The pain only occurs when she is settling down for bed.  She says it will feel like a "jolt" across her chest that just last for seconds most on the left upper chest.  Occasionally will go towards the left shoulder.  But then she will start to feel really uneasy.  Like her heart starts to race and she then starts to feel nauseated almost like she needs to have a bowel movement but does not actually get up to try to have a bowel movement.  Also feels shaky similar to when she sometimes feels anxious.  Is again easiness sensation last for about 20 to 30 minutes.  It then makes it really difficult for her to fall asleep.  She denies SOB, f/s/c/n (only when she has CP)/d, headaches, body aches.   I asked about acid reflux and she informed me that she did have a Hx of this but when she stopped drinking sodas this calmed down and she hasn't had any issues.   She uses a Vape.  She feels happy and is starting school soon.  She denies any change in bowels or constipation or diarrhea.  She does report occasionally she will notice a little fluttering in her chest area during the daytime.  But no specific triggers.  Each night she will sit in bed and lay on her back to watch TV for a little bit and that is usually when it happens.    Past medical history, Surgical history, Family history not pertinant except as noted below, Social history,  Allergies, and medications have been entered into the medical record, reviewed, and corrections made.   Review of Systems: No fevers, chills, night sweats, weight loss, chest pain, or shortness of breath.   Objective:    General: Speaking clearly in complete sentences without any shortness of breath.  Alert and oriented x3.  Normal judgment. No apparent acute distress.    Impression and Recommendations:   Atypical chest pain-unlikely to be cardiac or pulmonary.  I think it somewhat positional as it always seems to happen at night when she is lying back in bed watching TV.  We discussed getting some screening labs just to rule out anemia and thyroid disorder.  She does have a family history in her mother thyroid problems.    Anxiety - We discussed that the uneasiness that she is feeling for about 20 to 30 minutes after the chest pain is likely anxiety related.  Typically she gets up and cleans when she feels anxious and that helps her but in this case she does not get up because her son is with her.   I discussed the assessment and treatment plan with the patient. The patient was provided an opportunity to ask questions and all were answered. The patient agreed with the plan and demonstrated an understanding of the instructions.   The patient was advised to call back or seek  an in-person evaluation if the symptoms worsen or if the condition fails to improve as anticipated.   Beatrice Lecher, MD

## 2019-09-26 NOTE — Progress Notes (Signed)
sxs x 1 month. She stated that this has been off/on and it has happened about 4x this past month. The pain only occurs when she is settling down for bed.   She denies SOB, f/s/c/n (only when she has CP)/d, headaches, body aches.  She stated that she does have some racing of her heart, and gets an uneasy feeling begins to feel nauseated and feels like she needs to have a BM but doesn't. Whenever the CP happens it lasts for only a few minutes however, she has the feeling of uneasiness for about 20-30 minutes.   I asked about acid reflux and she informed me that she did have a Hx of this but when she stopped drinking sodas this calmed down and she hasn't had any issues.   She uses a Vape

## 2019-11-12 ENCOUNTER — Encounter: Payer: BC Managed Care – PPO | Admitting: Family Medicine

## 2019-11-12 ENCOUNTER — Telehealth: Payer: Self-pay | Admitting: Physician Assistant

## 2019-11-12 NOTE — Telephone Encounter (Signed)
Patient left a voicemail to cancel and reschedule appointment. She called at 8:25am. No further questions at this time.

## 2019-12-02 ENCOUNTER — Encounter: Payer: BC Managed Care – PPO | Admitting: Family Medicine

## 2019-12-17 ENCOUNTER — Encounter: Payer: Self-pay | Admitting: Medical-Surgical

## 2020-01-31 ENCOUNTER — Other Ambulatory Visit: Payer: Self-pay

## 2020-01-31 ENCOUNTER — Ambulatory Visit (INDEPENDENT_AMBULATORY_CARE_PROVIDER_SITE_OTHER): Payer: BC Managed Care – PPO | Admitting: Medical-Surgical

## 2020-01-31 ENCOUNTER — Encounter: Payer: Self-pay | Admitting: Medical-Surgical

## 2020-01-31 VITALS — BP 121/87 | HR 93 | Temp 97.7°F | Ht 65.0 in | Wt 118.3 lb

## 2020-01-31 DIAGNOSIS — Z0000 Encounter for general adult medical examination without abnormal findings: Secondary | ICD-10-CM | POA: Diagnosis not present

## 2020-01-31 DIAGNOSIS — R0789 Other chest pain: Secondary | ICD-10-CM | POA: Diagnosis not present

## 2020-01-31 DIAGNOSIS — Z124 Encounter for screening for malignant neoplasm of cervix: Secondary | ICD-10-CM

## 2020-01-31 NOTE — Progress Notes (Signed)
HPI: Cynthia Carroll is a 24 y.o. female who  has a past medical history of Anxiety, Bipolar disorder (HCC), Depression, and Eclampsia affecting pregnancy.  she presents to [facility] today, 01/31/20,  for chief complaint of:  Annual physical exam with Pap smear  Reports small area of swelling just below the left axilla, painless today but has had intermittent soreness over the area.  No changes in size, erythema, or visible lesions.  Irregular menses with approximately 5-6 periods per year.  Notable increase in dark body hair since her pregnancy.  Past medical, surgical, social and family history reviewed:  Patient Active Problem List   Diagnosis Date Noted  . Abdominal right lower quadrant swelling 12/10/2017  . Abnormal uterine bleeding (AUB) 12/10/2017  . History of nephrolithiasis 12/04/2017  . Acute right flank pain 12/04/2017  . Postcoital bleeding 12/01/2017  . History of eclampsia 12/01/2016  . Tobacco use disorder 12/01/2016  . Mild hypertension 05/16/2016  . Bipolar disorder (HCC) 04/01/2014    Past Surgical History:  Procedure Laterality Date  . CESAREAN SECTION N/A 04/02/2016   Procedure: CESAREAN SECTION;  Surgeon: Catalina Antigua, MD;  Location: [facility] BIRTHING SUITES;  Service: Obstetrics;  Laterality: N/A;    Social History   Tobacco Use  . Smoking status: Former Smoker    Packs/day: 0.50    Quit date: 01/30/2018    Years since quitting: 2.0  . Smokeless tobacco: Never Used  . Tobacco comment: pt vapes  Substance Use Topics  . Alcohol use: Yes    Comment: Occasionally    Family History  Problem Relation Age of Onset  . Heart disease Maternal Grandfather   . Celiac disease Mother   . Hypertension Mother      Current medication list and allergy/intolerance information reviewed:    No current outpatient medications on file.   No current facility-administered medications for this visit.    No Known Allergies    Review of Systems:  Constitutional:  No  fever, no chills, No recent illness, No unintentional weight changes. No significant fatigue.   HEENT: No  headache, no vision change, no hearing change, No sore throat, No  sinus pressure  Cardiac: No  chest pain, No  pressure, No palpitations, No  Orthopnea  Respiratory:  No  shortness of breath. No  Cough  Gastrointestinal: No  abdominal pain, No  nausea, No  vomiting,  No  blood in stool, No  diarrhea, No  constipation   Musculoskeletal: No new myalgia/arthralgia  Skin: No  Rash, No other wounds/concerning lesions  Genitourinary: No  incontinence, No  abnormal genital bleeding, No abnormal genital discharge  Hem/Onc: No  easy bruising/bleeding, No  abnormal lymph node  Endocrine: No cold intolerance,  No heat intolerance. No polyuria/polydipsia/polyphagia   Neurologic: No  weakness, No  dizziness, No  slurred speech/focal weakness/facial droop  Psychiatric: No  concerns with depression, No  concerns with anxiety, No sleep problems, No mood problems  Exam:  BP 121/87   Pulse 93   Temp 97.7 F (36.5 C) (Oral)   Ht 5\' 5"  (1.651 m)   Wt 118 lb 4.8 oz (53.7 kg)   LMP 12/03/2019   SpO2 100%   BMI 19.69 kg/m   Constitutional: VS see above. General Appearance: alert, well-developed, well-nourished, NAD  Eyes: Normal lids and conjunctive, non-icteric sclera  Ears, Nose, Mouth, Throat: MMM, Normal external inspection ears/nares/mouth/lips/gums. TM normal bilaterally. Pharynx/tonsils no erythema, no exudate. Nasal mucosa normal.   Neck:  No masses, trachea midline. No thyroid enlargement. No tenderness/mass appreciated. No lymphadenopathy  Respiratory: Normal respiratory effort. no wheeze, no rhonchi, no rales  Cardiovascular: S1/S2 normal, no murmur, no rub/gallop auscultated. RRR. No lower extremity edema. Pedal pulse II/IV bilaterally DP and PT. No carotid bruit or JVD. No abdominal aortic bruit.  Gastrointestinal: Nontender,  no masses. No hepatomegaly, no splenomegaly. No hernia appreciated. Bowel sounds normal. Rectal exam deferred.   Musculoskeletal: Gait normal. No clubbing/cyanosis of digits.   Neurological: Normal balance/coordination. No tremor. No cranial nerve deficit on limited exam. Motor and sensation intact and symmetric. Cerebellar reflexes intact.   Skin: warm, dry, intact. No rash/ulcer. No concerning nevi or subq nodules on limited exam.    Psychiatric: Normal judgment/insight. Normal mood and affect. Oriented x3.   Pelvic exam: normal external genitalia, vulva, vagina, cervix, uterus and adnexa.  Pap smear completed.  Chaperoned by Glennie Hawk, MA.  No results found for this or any previous visit (from the past 72 hour(s)).  No results found.  ASSESSMENT/PLAN:   1. Annual physical exam Checking CBC, CMP today.  Recommend yearly eye exam and every 6 month dental care.  Health maintenance information provided on AVS.  2.  Cervical cancer screening Pap smear completed today.  3.  Irregular menses May benefit from evaluation for PCOS given oligomenorrhea and a noticeable increase in body hair.  No orders of the defined types were placed in this encounter.   No orders of the defined types were placed in this encounter.   Patient Instructions    Health Maintenance, Female Adopting a healthy lifestyle and getting preventive care are important in promoting health and wellness. Ask your health care provider about:  The right schedule for you to have regular tests and exams.  Things you can do on your own to prevent diseases and keep yourself healthy. What should I know about diet, weight, and exercise? Eat a healthy diet   Eat a diet that includes plenty of vegetables, fruits, low-fat dairy products, and lean protein.  Do not eat a lot of foods that are high in solid fats, added sugars, or sodium. Maintain a healthy weight Body mass index (BMI) is used to identify weight  problems. It estimates body fat based on height and weight. Your health care provider can help determine your BMI and help you achieve or maintain a healthy weight. Get regular exercise Get regular exercise. This is one of the most important things you can do for your health. Most adults should:  Exercise for at least 150 minutes each week. The exercise should increase your heart rate and make you sweat (moderate-intensity exercise).  Do strengthening exercises at least twice a week. This is in addition to the moderate-intensity exercise.  Spend less time sitting. Even light physical activity can be beneficial. Watch cholesterol and blood lipids Have your blood tested for lipids and cholesterol at 26 years of age, then have this test every 5 years. Have your cholesterol levels checked more often if:  Your lipid or cholesterol levels are high.  You are older than 26 years of age.  You are at high risk for heart disease. What should I know about cancer screening? Depending on your health history and family history, you may need to have cancer screening at various ages. This may include screening for:  Breast cancer.  Cervical cancer.  Colorectal cancer.  Skin cancer.  Lung cancer. What should I know about heart disease, diabetes, and high blood pressure? Blood pressure  and heart disease  High blood pressure causes heart disease and increases the risk of stroke. This is more likely to develop in people who have high blood pressure readings, are of African descent, or are overweight.  Have your blood pressure checked: ? Every 3-5 years if you are 65-73 years of age. ? Every year if you are 37 years old or older. Diabetes Have regular diabetes screenings. This checks your fasting blood sugar level. Have the screening done:  Once every three years after age 40 if you are at a normal weight and have a low risk for diabetes.  More often and at a younger age if you are overweight or  have a high risk for diabetes. What should I know about preventing infection? Hepatitis B If you have a higher risk for hepatitis B, you should be screened for this virus. Talk with your health care provider to find out if you are at risk for hepatitis B infection. Hepatitis C Testing is recommended for:  Everyone born from [birth year] through 1967.  Anyone with known risk factors for hepatitis C. Sexually transmitted infections (STIs)  Get screened for STIs, including gonorrhea and chlamydia, if: ? You are sexually active and are younger than 26 years of age. ? You are older than 26 years of age and your health care provider tells you that you are at risk for this type of infection. ? Your sexual activity has changed since you were last screened, and you are at increased risk for chlamydia or gonorrhea. Ask your health care provider if you are at risk.  Ask your health care provider about whether you are at high risk for HIV. Your health care provider may recommend a prescription medicine to help prevent HIV infection. If you choose to take medicine to prevent HIV, you should first get tested for HIV. You should then be tested every 3 months for as long as you are taking the medicine. Pregnancy  If you are about to stop having your period (premenopausal) and you may become pregnant, seek counseling before you get pregnant.  Take 400 to 800 micrograms (mcg) of folic acid every day if you become pregnant.  Ask for birth control (contraception) if you want to prevent pregnancy. Osteoporosis and menopause Osteoporosis is a disease in which the bones lose minerals and strength with aging. This can result in bone fractures. If you are 57 years old or older, or if you are at risk for osteoporosis and fractures, ask your health care provider if you should:  Be screened for bone loss.  Take a calcium or vitamin D supplement to lower your risk of fractures.  Be given hormone replacement therapy (HRT)  to treat symptoms of menopause. Follow these instructions at home: Lifestyle  Do not use any products that contain nicotine or tobacco, such as cigarettes, e-cigarettes, and chewing tobacco. If you need help quitting, ask your health care provider.  Do not use street drugs.  Do not share needles.  Ask your health care provider for help if you need support or information about quitting drugs. Alcohol use  Do not drink alcohol if: ? Your health care provider tells you not to drink. ? You are pregnant, may be pregnant, or are planning to become pregnant.  If you drink alcohol: ? Limit how much you use to 0-1 drink a day. ? Limit intake if you are breastfeeding.  Be aware of how much alcohol is in your drink. In the [place] one drink equals  one 12 oz bottle of beer (355 mL), one 5 oz glass of wine (148 mL), or one 1 oz glass of hard liquor (44 mL). General instructions  Schedule regular health, dental, and eye exams.  Stay current with your vaccines.  Tell your health care provider if: ? You often feel depressed. ? You have ever been abused or do not feel safe at home. Summary  Adopting a healthy lifestyle and getting preventive care are important in promoting health and wellness.  Follow your health care provider's instructions about healthy diet, exercising, and getting tested or screened for diseases.  Follow your health care provider's instructions on monitoring your cholesterol and blood pressure. This information is not intended to replace advice given to you by your health care provider. Make sure you discuss any questions you have with your health care provider. Document Revised: 09/19/2018 Document Reviewed: 09/19/2018 Elsevier Patient Education  2020 [employer].   Follow-up plan: Return in 1 year (on 01/30/2021) for annual physical exam or sooner if needed.  Thayer Ohm, DNP, APRN, FNP-BC [facility]

## 2020-01-31 NOTE — Patient Instructions (Signed)
Health Maintenance, Female Adopting a healthy lifestyle and getting preventive care are important in promoting health and wellness. Ask your health care provider about:  The right schedule for you to have regular tests and exams.  Things you can do on your own to prevent diseases and keep yourself healthy. What should I know about diet, weight, and exercise? Eat a healthy diet   Eat a diet that includes plenty of vegetables, fruits, low-fat dairy products, and lean protein.  Do not eat a lot of foods that are high in solid fats, added sugars, or sodium. Maintain a healthy weight Body mass index (BMI) is used to identify weight problems. It estimates body fat based on height and weight. Your health care provider can help determine your BMI and help you achieve or maintain a healthy weight. Get regular exercise Get regular exercise. This is one of the most important things you can do for your health. Most adults should:  Exercise for at least 150 minutes each week. The exercise should increase your heart rate and make you sweat (moderate-intensity exercise).  Do strengthening exercises at least twice a week. This is in addition to the moderate-intensity exercise.  Spend less time sitting. Even light physical activity can be beneficial. Watch cholesterol and blood lipids Have your blood tested for lipids and cholesterol at 26 years of age, then have this test every 5 years. Have your cholesterol levels checked more often if:  Your lipid or cholesterol levels are high.  You are older than 26 years of age.  You are at high risk for heart disease. What should I know about cancer screening? Depending on your health history and family history, you may need to have cancer screening at various ages. This may include screening for:  Breast cancer.  Cervical cancer.  Colorectal cancer.  Skin cancer.  Lung cancer. What should I know about heart disease, diabetes, and high blood  pressure? Blood pressure and heart disease  High blood pressure causes heart disease and increases the risk of stroke. This is more likely to develop in people who have high blood pressure readings, are of African descent, or are overweight.  Have your blood pressure checked: ? Every 3-5 years if you are 18-39 years of age. ? Every year if you are 40 years old or older. Diabetes Have regular diabetes screenings. This checks your fasting blood sugar level. Have the screening done:  Once every three years after age 40 if you are at a normal weight and have a low risk for diabetes.  More often and at a younger age if you are overweight or have a high risk for diabetes. What should I know about preventing infection? Hepatitis B If you have a higher risk for hepatitis B, you should be screened for this virus. Talk with your health care provider to find out if you are at risk for hepatitis B infection. Hepatitis C Testing is recommended for:  Everyone born from 1945 through 1965.  Anyone with known risk factors for hepatitis C. Sexually transmitted infections (STIs)  Get screened for STIs, including gonorrhea and chlamydia, if: ? You are sexually active and are younger than 26 years of age. ? You are older than 26 years of age and your health care provider tells you that you are at risk for this type of infection. ? Your sexual activity has changed since you were last screened, and you are at increased risk for chlamydia or gonorrhea. Ask your health care provider if   you are at risk.  Ask your health care provider about whether you are at high risk for HIV. Your health care provider may recommend a prescription medicine to help prevent HIV infection. If you choose to take medicine to prevent HIV, you should first get tested for HIV. You should then be tested every 3 months for as long as you are taking the medicine. Pregnancy  If you are about to stop having your period (premenopausal) and  you may become pregnant, seek counseling before you get pregnant.  Take 400 to 800 micrograms (mcg) of folic acid every day if you become pregnant.  Ask for birth control (contraception) if you want to prevent pregnancy. Osteoporosis and menopause Osteoporosis is a disease in which the bones lose minerals and strength with aging. This can result in bone fractures. If you are 65 years old or older, or if you are at risk for osteoporosis and fractures, ask your health care provider if you should:  Be screened for bone loss.  Take a calcium or vitamin D supplement to lower your risk of fractures.  Be given hormone replacement therapy (HRT) to treat symptoms of menopause. Follow these instructions at home: Lifestyle  Do not use any products that contain nicotine or tobacco, such as cigarettes, e-cigarettes, and chewing tobacco. If you need help quitting, ask your health care provider.  Do not use street drugs.  Do not share needles.  Ask your health care provider for help if you need support or information about quitting drugs. Alcohol use  Do not drink alcohol if: ? Your health care provider tells you not to drink. ? You are pregnant, may be pregnant, or are planning to become pregnant.  If you drink alcohol: ? Limit how much you use to 0-1 drink a day. ? Limit intake if you are breastfeeding.  Be aware of how much alcohol is in your drink. In the [place] one drink equals one 12 oz bottle of beer (355 mL), one 5 oz glass of wine (148 mL), or one 1 oz glass of hard liquor (44 mL). General instructions  Schedule regular health, dental, and eye exams.  Stay current with your vaccines.  Tell your health care provider if: ? You often feel depressed. ? You have ever been abused or do not feel safe at home. Summary  Adopting a healthy lifestyle and getting preventive care are important in promoting health and wellness.  Follow your health care provider's instructions about healthy  diet, exercising, and getting tested or screened for diseases.  Follow your health care provider's instructions on monitoring your cholesterol and blood pressure. This information is not intended to replace advice given to you by your health care provider. Make sure you discuss any questions you have with your health care provider. Document Revised: 09/19/2018 Document Reviewed: 09/19/2018 Elsevier Patient Education  2020 Elsevier Inc.  

## 2020-02-01 LAB — CBC
HCT: 41 % (ref 35.0–45.0)
Hemoglobin: 13.9 g/dL (ref 11.7–15.5)
MCH: 31.2 pg (ref 27.0–33.0)
MCHC: 33.9 g/dL (ref 32.0–36.0)
MCV: 91.9 fL (ref 80.0–100.0)
MPV: 8.6 fL (ref 7.5–12.5)
Platelets: 359 10*3/uL (ref 140–400)
RBC: 4.46 10*6/uL (ref 3.80–5.10)
RDW: 12 % (ref 11.0–15.0)
WBC: 4.3 10*3/uL (ref 3.8–10.8)

## 2020-02-01 LAB — TSH: TSH: 1.45 mIU/L

## 2020-02-01 LAB — COMPLETE METABOLIC PANEL WITH GFR
AG Ratio: 2 (calc) (ref 1.0–2.5)
ALT: 15 U/L (ref 6–29)
AST: 18 U/L (ref 10–30)
Albumin: 5 g/dL (ref 3.6–5.1)
Alkaline phosphatase (APISO): 55 U/L (ref 31–125)
BUN: 16 mg/dL (ref 7–25)
CO2: 29 mmol/L (ref 20–32)
Calcium: 10 mg/dL (ref 8.6–10.2)
Chloride: 102 mmol/L (ref 98–110)
Creat: 0.78 mg/dL (ref 0.50–1.10)
GFR, Est African American: 122 mL/min/{1.73_m2} (ref >=60)
GFR, Est Non African American: 106 mL/min/{1.73_m2} (ref >=60)
Globulin: 2.5 g/dL (calc) (ref 1.9–3.7)
Glucose, Bld: 87 mg/dL (ref 65–139)
Potassium: 4.3 mmol/L (ref 3.5–5.3)
Sodium: 139 mmol/L (ref 135–146)
Total Bilirubin: 0.5 mg/dL (ref 0.2–1.2)
Total Protein: 7.5 g/dL (ref 6.1–8.1)

## 2020-02-03 LAB — CYTOLOGY - PAP

## 2020-04-21 ENCOUNTER — Encounter: Payer: Self-pay | Admitting: Medical-Surgical

## 2020-04-22 ENCOUNTER — Encounter: Payer: Self-pay | Admitting: Physician Assistant

## 2020-04-22 ENCOUNTER — Ambulatory Visit (INDEPENDENT_AMBULATORY_CARE_PROVIDER_SITE_OTHER): Payer: BC Managed Care – PPO | Admitting: Physician Assistant

## 2020-04-22 VITALS — BP 116/76 | HR 98 | Ht 65.0 in | Wt 118.0 lb

## 2020-04-22 DIAGNOSIS — R197 Diarrhea, unspecified: Secondary | ICD-10-CM | POA: Diagnosis not present

## 2020-04-22 DIAGNOSIS — Z8379 Family history of other diseases of the digestive system: Secondary | ICD-10-CM | POA: Diagnosis not present

## 2020-04-22 DIAGNOSIS — R140 Abdominal distension (gaseous): Secondary | ICD-10-CM

## 2020-04-22 DIAGNOSIS — R1084 Generalized abdominal pain: Secondary | ICD-10-CM | POA: Diagnosis not present

## 2020-04-22 NOTE — Progress Notes (Signed)
Subjective:    Patient ID: [name], female    DOB: March 10, 1996, 24 y.o.   MRN: [id number]  HPI  Patient is a 24 year old female who presents to the clinic with ongoing right lower quadrant fullness, bloating, diarrhea, abdominal cramping.  She has been seen for this before and a pelvic ultrasound was ordered on 12/01/2017 which was unremarkable.  She is just overall concerned with her symptoms and wants to be fully evaluated.  She has had particularly worse bleeding and swelling feeling lately.  She has not found any diet triggers.  She does have intermittent diarrhea.  She denies any nausea or vomiting or epigastric pain.  Her menstrual cycles are very irregular but does not find the symptoms are worse during her cycle.  She denies any melena, hematochezia, mucus in stools.  Her mother has been diagnosed with celiac and her brother with Crohn's.  .. Active Ambulatory Problems    Diagnosis Date Noted  . Mild hypertension 05/16/2016  . History of eclampsia 12/01/2016  . Tobacco use disorder 12/01/2016  . Postcoital bleeding 12/01/2017  . Bipolar disorder (HCC) 04/01/2014  . History of nephrolithiasis 12/04/2017  . Acute right flank pain 12/04/2017  . Abdominal right lower quadrant swelling 12/10/2017  . Abnormal uterine bleeding (AUB) 12/10/2017  . Bloating 04/24/2020  . Diarrhea 04/24/2020  . Generalized abdominal pain 04/24/2020   Resolved Ambulatory Problems    Diagnosis Date Noted  . Supervision of normal first pregnancy, antepartum 10/12/2015  . Chlamydia infection affecting pregnancy in first trimester, antepartum 10/26/2015  . UTI (urinary tract infection) in pregnancy in first trimester 10/26/2015  . Mild preeclampsia 03/28/2016  . Preeclampsia, severe 03/29/2016  . Eclampsia, delivered 04/02/2016  . Abscess of skin 11/29/2016  . Encounter for initial prescription of contraceptive pills 12/10/2017  . Urinary frequency 06/20/2018   Past Medical History:   Diagnosis Date  . Anxiety   . Depression   . Eclampsia affecting pregnancy       Review of Systems See HPI.     Objective:   Physical Exam Vitals reviewed.  Constitutional:      Appearance: Normal appearance.  Cardiovascular:     Rate and Rhythm: Normal rate and regular rhythm.     Pulses: Normal pulses.     Heart sounds: Normal heart sounds.  Pulmonary:     Effort: Pulmonary effort is normal.     Breath sounds: Normal breath sounds.  Abdominal:     General: Bowel sounds are normal. There is no distension.     Palpations: Abdomen is soft.     Tenderness: There is no right CVA tenderness, left CVA tenderness or guarding.     Comments: Generalized diffuse very mild discomfort noted with palpation.   Neurological:     General: No focal deficit present.     Mental Status: She is alert and oriented to person, place, and time.  Psychiatric:        Mood and Affect: Mood normal.           Assessment & Plan:  Marland KitchenMarland KitchenGissele was seen today for bloated.  Diagnoses and all orders for this visit:  Bloating -     Cancel: Celiac Disease Comprehensive Panel w Reflexes, Infant -     IgG Allergens (92) Foods -     Celiac Disease Ab Screen w/Rfx -     Ambulatory referral to Gastroenterology  Diarrhea, unspecified type -     Cancel: Celiac Disease Comprehensive Panel w Reflexes, Infant -  IgG Allergens (92) Foods -     Celiac Disease Ab Screen w/Rfx -     Ambulatory referral to Gastroenterology  Generalized abdominal pain -     Cancel: Celiac Disease Comprehensive Panel w Reflexes, Infant -     IgG Allergens (92) Foods -     Celiac Disease Ab Screen w/Rfx -     Ambulatory referral to Gastroenterology  Family history of celiac disease -     Ambulatory referral to Gastroenterology  Family history of Crohn's disease -     Ambulatory referral to Gastroenterology   Unclear etiology. Suspect more IBS-D with some food intolerances. Will order IGG panel and due to mothers  history of celiac will get celiac labs.  Start probiotic.  Went ahead and made GI referral at request of patient. No red flags with melena or hematochezia/severe abdominal pain/rashes/mucus in stool.  Keep food diary.   Follow up in 2 weeks with PcP.   Spent 30 minutes with patient.

## 2020-04-22 NOTE — Patient Instructions (Signed)
Diet for Irritable Bowel Syndrome  When you have irritable bowel syndrome (IBS), it is very important to eat the foods and follow the eating habits that are best for your condition. IBS may cause various symptoms such as pain in the abdomen, constipation, or diarrhea. Choosing the right foods can help to ease the discomfort from these symptoms. Work with your health care provider and diet and nutrition specialist (dietitian) to find the eating plan that will help to control your symptoms. What are tips for following this plan?      Keep a food diary. This will help you identify foods that cause symptoms. Write down: ? What you eat and when you eat it. ? What symptoms you have. ? When symptoms occur in relation to your meals, such as "pain in abdomen 2 hours after dinner."  Eat your meals slowly and in a relaxed setting.  Aim to eat 5-6 small meals per day. Do not skip meals.  Drink enough fluid to keep your urine pale yellow.  Ask your health care provider if you should take an over-the-counter probiotic to help restore healthy bacteria in your gut (digestive tract). ? Probiotics are foods that contain good bacteria and yeasts.  Your dietitian may have specific dietary recommendations for you based on your symptoms. He or she may recommend that you: ? Avoid foods that cause symptoms. Talk with your dietitian about other ways to get the same nutrients that are in those problem foods. ? Avoid foods with gluten. Gluten is a protein that is found in rye, wheat, and barley. ? Eat more foods that contain soluble fiber. Examples of foods with high soluble fiber include oats, seeds, and certain fruits and vegetables. Take a fiber supplement if directed by your dietitian. ? Reduce or avoid certain foods called FODMAPs. These are foods that contain carbohydrates that are hard to digest. Ask your doctor which foods contain these carbohydrates. What foods are not recommended? The following are some  foods and drinks that may make your symptoms worse:  Fatty foods, such as french fries.  Foods that contain gluten, such as pasta and cereal.  Dairy products, such as milk, cheese, and ice cream.  Chocolate.  Alcohol.  Products with caffeine, such as coffee.  Carbonated drinks, such as soda.  Foods that are high in FODMAPs. These include certain fruits and vegetables.  Products with sweeteners such as honey, high fructose corn syrup, sorbitol, and mannitol. The items listed above may not be a complete list of foods and beverages you should avoid. Contact a dietitian for more information. What foods are good sources of fiber? Your health care provider or dietitian may recommend that you eat more foods that contain fiber. Fiber can help to reduce constipation and other IBS symptoms. Add foods with fiber to your diet a little at a time so your body can get used to them. Too much fiber at one time might cause gas and swelling of your abdomen. The following are some foods that are good sources of fiber:  Berries, such as raspberries, strawberries, and blueberries.  Tomatoes.  Carrots.  Brown rice.  Oats.  Seeds, such as chia and pumpkin seeds. The items listed above may not be a complete list of recommended sources of fiber. Contact your dietitian for more options. Where to find more information  International Foundation for Functional Gastrointestinal Disorders: www.iffgd.org  National Institute of Diabetes and Digestive and Kidney Diseases: www.niddk.nih.gov Summary  When you have irritable bowel syndrome (IBS), it   is very important to eat the foods and follow the eating habits that are best for your condition.  IBS may cause various symptoms such as pain in the abdomen, constipation, or diarrhea.  Choosing the right foods can help to ease the discomfort that comes from symptoms.  Keep a food diary. This will help you identify foods that cause symptoms.  Your health  care provider or diet and nutrition specialist (dietitian) may recommend that you eat more foods that contain fiber. This information is not intended to replace advice given to you by your health care provider. Make sure you discuss any questions you have with your health care provider. Document Revised: 01/16/2019 Document Reviewed: 05/30/2017 Elsevier Patient Education  2020 Elsevier Inc.  

## 2020-04-23 DIAGNOSIS — R1084 Generalized abdominal pain: Secondary | ICD-10-CM | POA: Diagnosis not present

## 2020-04-23 DIAGNOSIS — R197 Diarrhea, unspecified: Secondary | ICD-10-CM | POA: Diagnosis not present

## 2020-04-23 DIAGNOSIS — R140 Abdominal distension (gaseous): Secondary | ICD-10-CM | POA: Diagnosis not present

## 2020-04-24 DIAGNOSIS — Z8379 Family history of other diseases of the digestive system: Secondary | ICD-10-CM | POA: Insufficient documentation

## 2020-04-24 DIAGNOSIS — R1084 Generalized abdominal pain: Secondary | ICD-10-CM | POA: Insufficient documentation

## 2020-04-24 DIAGNOSIS — R140 Abdominal distension (gaseous): Secondary | ICD-10-CM | POA: Insufficient documentation

## 2020-04-24 DIAGNOSIS — R197 Diarrhea, unspecified: Secondary | ICD-10-CM | POA: Insufficient documentation

## 2020-04-25 LAB — IGG ALLERGENS (92) FOODS
Casein IgG: 8.2 ug/mL — ABNORMAL HIGH (ref 0.0–1.9)
Cheese, Mold Type IgG: 18.5 ug/mL — ABNORMAL HIGH (ref 0.0–1.9)
Chicken IgG: <2 ug/mL (ref 0.0–1.9)
Chili Pepper IgG: 5.2 ug/mL — ABNORMAL HIGH (ref 0.0–1.9)
Chocolate/Cacao IgG: 2.6 ug/mL — ABNORMAL HIGH (ref 0.0–1.9)
Coffee IgG: 4.1 ug/mL — ABNORMAL HIGH (ref 0.0–1.9)
Corn IgG: 3.9 ug/mL — ABNORMAL HIGH (ref 0.0–1.9)
Egg, Whole IgG: 2.4 ug/mL — ABNORMAL HIGH (ref 0.0–1.9)
F003-IgG Codfish: <2 ug/mL (ref 0.0–1.9)
F006-IgG Barley, Whole: 7.2 ug/mL — ABNORMAL HIGH (ref 0.0–1.9)
F009-IgG Rice: 6.5 ug/mL — ABNORMAL HIGH (ref 0.0–1.9)
F010-IgG Sesame Seed: 7.8 ug/mL — ABNORMAL HIGH (ref 0.0–1.9)
F012-IgG Green Pea: <2 ug/mL (ref 0.0–1.9)
F015-IgG White Bean: <2 ug/mL (ref 0.0–1.9)
F020-IgG Almond: <2 ug/mL (ref 0.0–1.9)
F023-IgG Crab: 3.8 ug/mL — ABNORMAL HIGH (ref 0.0–1.9)
F027-IgG Beef: 15.1 ug/mL — ABNORMAL HIGH (ref 0.0–1.9)
F031-IgG Carrot: 2.8 ug/mL — ABNORMAL HIGH (ref 0.0–1.9)
F033-IgG Orange: 2.9 ug/mL — ABNORMAL HIGH (ref 0.0–1.9)
F040-IgG Tuna: <2 ug/mL (ref 0.0–1.9)
F041-IgG Salmon: <2 ug/mL (ref 0.0–1.9)
F044-IgG Strawberry: 3.8 ug/mL — ABNORMAL HIGH (ref 0.0–1.9)
F047-IgG Garlic: 9.3 ug/mL — ABNORMAL HIGH (ref 0.0–1.9)
F049-IgG Apple: 2.2 ug/mL — ABNORMAL HIGH (ref 0.0–1.9)
F054-IgG Sweet Potato: <2 ug/mL (ref 0.0–1.9)
F080-IgG Lobster: 4.3 ug/mL — ABNORMAL HIGH (ref 0.0–1.9)
F085-IgG Celery: 2.1 ug/mL — ABNORMAL HIGH (ref 0.0–1.9)
F087-IgG Melon: 2.2 ug/mL — ABNORMAL HIGH (ref 0.0–1.9)
F089-IgG Mustard: 2.1 ug/mL — ABNORMAL HIGH (ref 0.0–1.9)
F092-IgG Banana: 10.9 ug/mL — ABNORMAL HIGH (ref 0.0–1.9)
F094-IgG Pear: <2 ug/mL (ref 0.0–1.9)
F095-IgG Peach: <2 ug/mL (ref 0.0–1.9)
F096-IgG Avocado: <2 ug/mL (ref 0.0–1.9)
F147-IgG Flounder: <2 ug/mL (ref 0.0–1.9)
F204-IgG Trout: <2 ug/mL (ref 0.0–1.9)
F207-IgG Clam: 3.1 ug/mL — ABNORMAL HIGH (ref 0.0–1.9)
F208-IgG Lemon: 3.2 ug/mL — ABNORMAL HIGH (ref 0.0–1.9)
F209-IgG Grapefruit: <2 ug/mL (ref 0.0–1.9)
F210-IgG Pineapple: 2.5 ug/mL — ABNORMAL HIGH (ref 0.0–1.9)
F212-IgG Mushroom: 7.1 ug/mL — ABNORMAL HIGH (ref 0.0–1.9)
F214-IgG Spinach: 2.5 ug/mL — ABNORMAL HIGH (ref 0.0–1.9)
F215-IgG Lettuce: 2.7 ug/mL — ABNORMAL HIGH (ref 0.0–1.9)
F216-IgG Cabbage: <2 ug/mL (ref 0.0–1.9)
F217-IgG Brussel Sprouts: 2 ug/mL — ABNORMAL HIGH (ref 0.0–1.9)
F220-IgG Cinnamon: 2.4 ug/mL — ABNORMAL HIGH (ref 0.0–1.9)
F222-IgG Tea: 2.6 ug/mL — ABNORMAL HIGH (ref 0.0–1.9)
F225-IgG Pumpkin: <2 ug/mL (ref 0.0–1.9)
F234-IgG Vanilla: 27.5 ug/mL — ABNORMAL HIGH (ref 0.0–1.9)
F237-IgG Apricot: <2 ug/mL (ref 0.0–1.9)
F242-IgG Bing Cherry: 5.1 ug/mL — ABNORMAL HIGH (ref 0.0–1.9)
F244-IgG Cucumber: 3.6 ug/mL — ABNORMAL HIGH (ref 0.0–1.9)
F255-IgG Plum: 2.1 ug/mL — ABNORMAL HIGH (ref 0.0–1.9)
F259-IgG Grape: 2.3 ug/mL — ABNORMAL HIGH (ref 0.0–1.9)
F260-IgG Broccoli: 2.1 ug/mL — ABNORMAL HIGH (ref 0.0–1.9)
F261-IgG Asparagus: 2.4 ug/mL — ABNORMAL HIGH (ref 0.0–1.9)
F262-IgG Eggplant: 2 ug/mL — ABNORMAL HIGH (ref 0.0–1.9)
F263-IgG Green Bell Pepper: <2 ug/mL (ref 0.0–1.9)
F268-IgG Cloves: 3.8 ug/mL — ABNORMAL HIGH (ref 0.0–1.9)
F270-IgG Ginger: <2 ug/mL (ref 0.0–1.9)
F280-IgG Black Pepper: 12.2 ug/mL — ABNORMAL HIGH (ref 0.0–1.9)
F282-IgG Nutmeg: 2.4 ug/mL — ABNORMAL HIGH (ref 0.0–1.9)
F283-IgG Oregano: 3.1 ug/mL — ABNORMAL HIGH (ref 0.0–1.9)
F284-IgG Turkey: <2 ug/mL (ref 0.0–1.9)
F287-IgG Kidney Bean: <2 ug/mL (ref 0.0–1.9)
F288-IgG Blueberry: 4 ug/mL — ABNORMAL HIGH (ref 0.0–1.9)
F290-IgG Oyster: 2.7 ug/mL — ABNORMAL HIGH (ref 0.0–1.9)
F291-IgG Cauliflower: <2 ug/mL (ref 0.0–1.9)
F302-IgG Tangerine: <2 ug/mL (ref 0.0–1.9)
F303-IgG Halibut: <2 ug/mL (ref 0.0–1.9)
F306-IgG Lime: 2.5 ug/mL — ABNORMAL HIGH (ref 0.0–1.9)
F329-IgG Watermelon: <2 ug/mL (ref 0.0–1.9)
F337-IgG Sole: <2 ug/mL (ref 0.0–1.9)
F338-IgG Scallop: <2 ug/mL (ref 0.0–1.9)
F342-IgG Olive, Black: 4.3 ug/mL — ABNORMAL HIGH (ref 0.0–1.9)
F344-IgG Sage, Salvia: 3.2 ug/mL — ABNORMAL HIGH (ref 0.0–1.9)
F381-IgG Red Snapper: <2 ug/mL (ref 0.0–1.9)
F384-IgG Whitefish: <2 ug/mL (ref 0.0–1.9)
Green Bean IgG: 2.4 ug/mL — ABNORMAL HIGH (ref 0.0–1.9)
Haddock IgG: 2.2 ug/mL — ABNORMAL HIGH (ref 0.0–1.9)
K084-IgG Sunflower Seed: 2.4 ug/mL — ABNORMAL HIGH (ref 0.0–1.9)
Lamb IgG: 7.1 ug/mL — ABNORMAL HIGH (ref 0.0–1.9)
Oat IgG: 6.4 ug/mL — ABNORMAL HIGH (ref 0.0–1.9)
Onion IgG: 4 ug/mL — ABNORMAL HIGH (ref 0.0–1.9)
Peanut IgG: <2 ug/mL (ref 0.0–1.9)
Pork IgG: 3.3 ug/mL — ABNORMAL HIGH (ref 0.0–1.9)
Potato, White, IgG: <2 ug/mL (ref 0.0–1.9)
Rye IgG: 4.9 ug/mL — ABNORMAL HIGH (ref 0.0–1.9)
Shrimp IgG: 2.4 ug/mL — ABNORMAL HIGH (ref 0.0–1.9)
Soybean IgG: <2 ug/mL (ref 0.0–1.9)
Tomato IgG: 2.1 ug/mL — ABNORMAL HIGH (ref 0.0–1.9)
Wheat IgG: 3.9 ug/mL — ABNORMAL HIGH (ref 0.0–1.9)
Yeast IgG: 2.2 ug/mL — ABNORMAL HIGH (ref 0.0–1.9)

## 2020-04-25 LAB — CELIAC DISEASE AB SCREEN W/RFX
Antigliadin Abs, IgA: 4 units (ref 0–19)
IgA/Immunoglobulin A, Serum: 274 mg/dL (ref 87–352)
Transglutaminase IgA: <2 U/mL (ref 0–3)

## 2020-04-27 NOTE — Progress Notes (Signed)
Alex,   You have numerous slight elevations but I would focus on IGG foods with GREATEST response. Your top 3 IGG response foods are:   The highest Vanilla, Cheese, Beef, Banana. Give this 2 weeks to see if you feel less bloating etc.   Testing for celiac negative.

## 2020-04-29 DIAGNOSIS — R1012 Left upper quadrant pain: Secondary | ICD-10-CM | POA: Diagnosis not present

## 2020-04-29 DIAGNOSIS — Z8379 Family history of other diseases of the digestive system: Secondary | ICD-10-CM | POA: Diagnosis not present

## 2020-04-29 DIAGNOSIS — R140 Abdominal distension (gaseous): Secondary | ICD-10-CM | POA: Diagnosis not present

## 2020-04-29 DIAGNOSIS — R194 Change in bowel habit: Secondary | ICD-10-CM | POA: Diagnosis not present

## 2020-04-30 ENCOUNTER — Emergency Department (HOSPITAL_BASED_OUTPATIENT_CLINIC_OR_DEPARTMENT_OTHER)
Admission: EM | Admit: 2020-04-30 | Discharge: 2020-04-30 | Disposition: A | Discharge status: Home / Self Care | Payer: BC Managed Care – PPO | Attending: Emergency Medicine | Admitting: Emergency Medicine

## 2020-04-30 ENCOUNTER — Other Ambulatory Visit: Payer: Self-pay

## 2020-04-30 ENCOUNTER — Encounter (HOSPITAL_BASED_OUTPATIENT_CLINIC_OR_DEPARTMENT_OTHER): Payer: Self-pay | Admitting: *Deleted

## 2020-04-30 DIAGNOSIS — R109 Unspecified abdominal pain: Secondary | ICD-10-CM | POA: Diagnosis not present

## 2020-04-30 DIAGNOSIS — M549 Dorsalgia, unspecified: Secondary | ICD-10-CM | POA: Insufficient documentation

## 2020-04-30 DIAGNOSIS — Z5321 Procedure and treatment not carried out due to patient leaving prior to being seen by health care provider: Secondary | ICD-10-CM | POA: Insufficient documentation

## 2020-04-30 LAB — PREGNANCY, URINE: Preg Test, Ur: NEGATIVE

## 2020-04-30 LAB — URINALYSIS, ROUTINE W REFLEX MICROSCOPIC
Bilirubin Urine: NEGATIVE
Glucose, UA: NEGATIVE mg/dL
Hgb urine dipstick: NEGATIVE
Ketones, ur: NEGATIVE mg/dL
Leukocytes,Ua: NEGATIVE
Nitrite: NEGATIVE
Protein, ur: NEGATIVE mg/dL
Specific Gravity, Urine: <1.005 — ABNORMAL LOW (ref 1.005–1.030)
pH: 6.5 (ref 5.0–8.0)

## 2020-04-30 NOTE — ED Triage Notes (Signed)
Left flank pain into her abdomen x 2 days. Pain improved after a BM yesterday but this am the pain was the same as prior to BM.

## 2020-05-07 DIAGNOSIS — N2889 Other specified disorders of kidney and ureter: Secondary | ICD-10-CM | POA: Diagnosis not present

## 2020-05-07 DIAGNOSIS — R194 Change in bowel habit: Secondary | ICD-10-CM | POA: Diagnosis not present

## 2020-05-07 DIAGNOSIS — R140 Abdominal distension (gaseous): Secondary | ICD-10-CM | POA: Diagnosis not present

## 2020-05-07 DIAGNOSIS — R109 Unspecified abdominal pain: Secondary | ICD-10-CM | POA: Diagnosis not present

## 2020-05-27 ENCOUNTER — Encounter: Payer: Self-pay | Admitting: Family Medicine

## 2020-05-27 ENCOUNTER — Ambulatory Visit (INDEPENDENT_AMBULATORY_CARE_PROVIDER_SITE_OTHER): Payer: Self-pay | Admitting: Family Medicine

## 2020-05-27 VITALS — BP 103/58 | HR 92 | Ht 65.0 in | Wt 117.0 lb

## 2020-05-27 DIAGNOSIS — R200 Anesthesia of skin: Secondary | ICD-10-CM

## 2020-05-27 DIAGNOSIS — H6983 Other specified disorders of Eustachian tube, bilateral: Secondary | ICD-10-CM

## 2020-05-27 DIAGNOSIS — G5602 Carpal tunnel syndrome, left upper limb: Secondary | ICD-10-CM

## 2020-05-27 MED ORDER — FLUTICASONE PROPIONATE 50 MCG/ACT NA SUSP: 2.0000 | Freq: Every day | NASAL | 2 refills | Status: DC

## 2020-05-27 NOTE — Patient Instructions (Signed)
Recommend Wear the splint only at night put it on before bedtime and then take it off in the morning.  It may take several weeks of wearing it to notice improvement.  Recommend a trial of nasal fluticasone.  Again may take about 4 to 5 days to see significant improvement in the ear popping and cracking.  If you try it for 2 weeks and or not improving then please let us know.

## 2020-05-27 NOTE — Progress Notes (Signed)
Pt reports that she woke up around 4 AM and her L arm was numb and this went on several times throughout the night/morning until 10 AM

## 2020-05-27 NOTE — Progress Notes (Signed)
Acute Office Visit  Subjective:    Patient ID: Cynthia Carroll, female    DOB: Feb 26, 1996, 26 y.o.   MRN: [id number]  Chief Complaint  Patient presents with  . ARM NUMBNESS    L arm  . Ear Pain    HPI Patient is in today for Left arm numbness.  Woke up up at 4AM this morning.  Lasted until about 10 AM.  Never happened before.  No CP or SOB.  Better now.  She says initially it was her whole arm she felt like her arm had actually fallen asleep where it just felt like she really could not move it.  That eventually resolved and then she felt like her whole arm and hand were numb.  Not in any specific distribution.  And that gradually improved and then it was just her hand that felt numb and then by about 10 AM all of the symptoms completely resolved.  Prior history of carpal tunnel syndrome.  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  Asked to check her ears. Feels like her Right ear has been bothering her for 2 weeks. Was popping.  Pain behind her ear and then started to affect her left ear. Has been in the pool.  She denies any significant hearing changes but occasionally will feel like she has a little change but usually is temporary.  He has noted some pain on the right side of her neck mostly in the muscles.  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  Does occasionally get wrist pain     Past Medical History:  Diagnosis Date  . Anxiety   . Bipolar disorder (HCC)   . Depression   . Eclampsia affecting pregnancy     Past Surgical History:  Procedure Laterality Date  . CESAREAN SECTION N/A 04/02/2016   Procedure: CESAREAN SECTION;  Surgeon: Catalina Antigua, MD;  Location: [facility] BIRTHING SUITES;  Service: Obstetrics;  Laterality: N/A;    Family History  Problem Relation Age of Onset  . Heart disease Maternal Grandfather   . Celiac disease Mother   . Hypertension Mother     Social History    Socioeconomic History  . Marital status: Single    Spouse name: Not on file  . Number of children: Not on file  . Years of education: Not on file  . Highest education level: Not on file  Occupational History  . Not on file  Tobacco Use  . Smoking status: Former Smoker    Packs/day: 0.50    Quit date: 01/30/2018    Years since quitting: 2.3  . Smokeless tobacco: Never Used  . Tobacco comment: pt vapes  Vaping Use  . Vaping Use: Every day  . Substances: Nicotine  Substance and Sexual Activity  . Alcohol use: Yes    Comment: Occasionally  . Drug use: No  . Sexual activity: Yes    Partners: Male    Birth control/protection: Condom  Other Topics Concern  . Not on file  Social History Narrative  . Not on file   Social Determinants of Health   Financial Resource Strain:   . Difficulty of Paying Living Expenses:   Food Insecurity:   . Worried About Programme researcher, broadcasting/film/video in the Last Year:   . Barista in the Last Year:   Transportation Needs:   . Freight forwarder (Medical):   Marland Kitchen  Lack of Transportation (Non-Medical):   Physical Activity:   . Days of Exercise per Week:   . Minutes of Exercise per Session:   Stress:   . Feeling of Stress :   Social Connections:   . Frequency of Communication with Friends and Family:   . Frequency of Social Gatherings with Friends and Family:   . Attends Religious Services:   . Active Member of Clubs or Organizations:   . Attends Banker Meetings:   Marland Kitchen Marital Status:   Intimate Partner Violence:   . Fear of Current or Ex-Partner:   . Emotionally Abused:   Marland Kitchen Physically Abused:   . Sexually Abused:     No outpatient medications prior to visit.   No facility-administered medications prior to visit.    No Known Allergies  Review of Systems     Objective:    Physical Exam Constitutional:      Appearance: She is well-developed.  HENT:     Head: Normocephalic and atraumatic.     Right Ear: External ear  normal.     Left Ear: External ear normal.     Ears:     Comments: She has some scarring on both tympanic membranes from tympanostomy tubes as a child.  There is a slightly distorted light reflex bilaterally no erythema or fluid.    Nose: Nose normal.  Eyes:     Conjunctiva/sclera: Conjunctivae normal.     Pupils: Pupils are equal, round, and reactive to light.  Neck:     Thyroid: No thyromegaly.  Cardiovascular:     Rate and Rhythm: Normal rate and regular rhythm.     Heart sounds: Normal heart sounds.  Pulmonary:     Effort: Pulmonary effort is normal.     Breath sounds: Normal breath sounds. No wheezing.  Musculoskeletal:     Cervical back: Neck supple.     Comments: Neck with normal flexion, extension, rotation right and left and sidebending.  Left shoulder elbow and wrist, and fingers with normal range of motion.  Negative Tinel sign.  She did get some numbness and tingling in the wrist with Phalen's on the left side.  Skin:    General: Skin is warm and dry.  Neurological:     Mental Status: She is alert and oriented to person, place, and time.     BP (!) 103/58   Pulse 92   Ht 5\' 5"  (1.651 m)   Wt 117 lb (53.1 kg)   SpO2 99%   BMI 19.47 kg/m  Wt Readings from Last 3 Encounters:  05/27/20 117 lb (53.1 kg)  04/30/20 118 lb (53.5 kg)  04/22/20 118 lb (53.5 kg)    Health Maintenance Due  Topic Date Due  . Hepatitis C Screening  Never done  . COVID-19 Vaccine (1) Never done  . INFLUENZA VACCINE  05/10/2020    There are no preventive care reminders to display for this patient.   Lab Results  Component Value Date   TSH 1.45 01/31/2020   Lab Results  Component Value Date   WBC 4.3 01/31/2020   HGB 13.9 01/31/2020   HCT 41.0 01/31/2020   MCV 91.9 01/31/2020   PLT 359 01/31/2020   Lab Results  Component Value Date   NA 139 01/31/2020   K 4.3 01/31/2020   CO2 29 01/31/2020   GLUCOSE 87 01/31/2020   BUN 16 01/31/2020   CREATININE 0.78 01/31/2020    BILITOT 0.5 01/31/2020   ALKPHOS 103 04/05/2016  AST 18 01/31/2020   ALT 15 01/31/2020   PROT 7.5 01/31/2020   ALBUMIN 3.1 (L) 04/05/2016   CALCIUM 10.0 01/31/2020   ANIONGAP 10 04/05/2016   No results found for: CHOL No results found for: HDL No results found for: LDLCALC No results found for: TRIG No results found for: CHOLHDL No results found for: SEGB1D     Assessment & Plan:   Problem List Items Addressed This Visit    None    Visit Diagnoses    Left arm numbness    -  Primary   Carpal tunnel syndrome on left       Dysfunction of both eustachian tubes       Relevant Medications   fluticasone (FLONASE) 50 MCG/ACT nasal spray      Left arm numbness-I do think this was probably somewhat positional she has never had it before and it did resolve after a few hours.  I do think it is probably worsened by carpal tunnel syndrome.  Suspect carpal tunnel syndrome of the left wrist-though she only started having symptoms today but she does a job that is very high risk for this and she has been having some wrist pain lately.  We will place a left cock-up splint to wear just at bedtime.  If not improving over several weeks then please let us know.  Eustachian tube dysfunction-trial of nasal steroid spray.  If not improving then can consider an oral decongestant.  Meds ordered this encounter  Medications  . fluticasone (FLONASE) 50 MCG/ACT nasal spray    Sig: Place 2 sprays into both nostrils daily.    Dispense:  16 g    Refill:  2   Time spent in encounter 30 minutes.  Nani Gasser, MD

## 2020-08-11 ENCOUNTER — Encounter: Payer: Self-pay | Admitting: Family Medicine

## 2020-08-11 NOTE — Progress Notes (Signed)
Virtual Visit via Video Note  I connected with Cynthia Carroll on 08/12/20 at 11:10 AM EDT by a video enabled telemedicine application and verified that I am speaking with the correct person using two identifiers.   I discussed the limitations of evaluation and management by telemedicine and the availability of in person appointments. The patient expressed understanding and agreed to proceed.  Patient location: home Provider locations: office  Subjective:    CC: sore throat, ear pain  HPI: Pleasant 24 year old female presenting via MyChart video visit for right ear pain over the last few months but is now accompanied by sore throat, right globus sensation, swollen right lymph node, mild fatigue, cough, sinus congestion, and left eye pressure. Was previously instructed to use Flonase daily for her right ear pain, found this helpful but symptoms still occur off and on. Notes right ear popping randomly with only occasional pain. 2-3 weeks ago, she was lying down and experienced a weird vibrating sound in the right ear that was very disturbing and prompted an anxiety attack. Has occasional ringing in the ear off and on. Three days ago, she developed a "lump" in her throat. This was uncomfortable but not painful and has improved somewhat since then. She also has had some mild left eye pressure, increased non-productive cough, fatigue, and a swollen right neck lymph node. No fever, chills, rhinorrhea, or GI symptoms. Has not taken any medications to help and is not currently using the Flonase. She has not had her COVID vaccines and has not been COVID tested. Has been struggling with tonsil stones recently but these symptoms are different. Lives with her boyfriend and her young son, neither with symptoms or signs of illness.    Past medical history, Surgical history, Family history not pertinant except as noted below, Social history, Allergies, and medications have been entered into the medical record,  reviewed, and corrections made.   Review of Systems: See HPI for pertinent positives and negatives.   Objective:    General: Speaking clearly in complete sentences without any shortness of breath.  Alert and oriented x3.  Normal judgment. No apparent acute distress.  Impression and Recommendations:    1. Respiratory symptoms/right otalgia Resume daily flonase. Start a daily OTC oral antihistamine. Suspect possible viral illness combined with eustachian tube dysfunction. Advise increased fluid intake. Ok to use Tylenol/Ibuprofen for discomfort. Drink warm liquids for throat discomfort. Consider warm compresses and massage of swollen lymph node.  Recommend COVID testing. Sending in Azithromycin with instructions to start on Friday if her symptoms do not improve with antihistamine/Flonase/conservative measures. May need a short burst of steroids if symptoms continue into next week.   I discussed the assessment and treatment plan with the patient. The patient was provided an opportunity to ask questions and all were answered. The patient agreed with the plan and demonstrated an understanding of the instructions.   The patient was advised to call back or seek an in-person evaluation if the symptoms worsen or if the condition fails to improve as anticipated.  20 minutes of non-face-to-face time was provided during this encounter.  Return if symptoms worsen or fail to improve.  Thayer Ohm, DNP, APRN, FNP-BC [facility]

## 2020-08-12 ENCOUNTER — Encounter: Payer: Self-pay | Admitting: Medical-Surgical

## 2020-08-12 ENCOUNTER — Telehealth (INDEPENDENT_AMBULATORY_CARE_PROVIDER_SITE_OTHER): Payer: Self-pay | Admitting: Medical-Surgical

## 2020-08-12 DIAGNOSIS — J029 Acute pharyngitis, unspecified: Secondary | ICD-10-CM

## 2020-08-12 DIAGNOSIS — H9201 Otalgia, right ear: Secondary | ICD-10-CM

## 2020-08-12 DIAGNOSIS — H9209 Otalgia, unspecified ear: Secondary | ICD-10-CM

## 2020-08-12 DIAGNOSIS — R0989 Other specified symptoms and signs involving the circulatory and respiratory systems: Secondary | ICD-10-CM

## 2020-08-12 MED ORDER — AZITHROMYCIN 250 MG PO TABS: ORAL_TABLET | ORAL | 0 refills | Status: DC

## 2020-10-07 ENCOUNTER — Encounter: Payer: Self-pay | Admitting: Nurse Practitioner

## 2020-10-07 ENCOUNTER — Telehealth (INDEPENDENT_AMBULATORY_CARE_PROVIDER_SITE_OTHER): Payer: Self-pay | Admitting: Nurse Practitioner

## 2020-10-07 ENCOUNTER — Encounter: Payer: Self-pay | Admitting: Medical-Surgical

## 2020-10-07 DIAGNOSIS — T25222A Burn of second degree of left foot, initial encounter: Secondary | ICD-10-CM

## 2020-10-07 DIAGNOSIS — T300 Burn of unspecified body region, unspecified degree: Secondary | ICD-10-CM

## 2020-10-07 DIAGNOSIS — T25221A Burn of second degree of right foot, initial encounter: Secondary | ICD-10-CM

## 2020-10-07 MED ORDER — TRAMADOL HCL 50 MG PO TABS: 50.0000 mg | ORAL_TABLET | Freq: Four times a day (QID) | ORAL | 0 refills | Status: AC | PRN

## 2020-10-07 MED ORDER — SILVER SULFADIAZINE 1 % EX CREA: TOPICAL_CREAM | CUTANEOUS | 1 refills | Status: DC

## 2020-10-07 NOTE — Patient Instructions (Addendum)
Twice a day routine: Very gently wash the burned areas with cool water and antibacterial soap using light touches with fingertips.  Pat dry with clean, soft towel- do not rub or apply pressure to avoid breaking the skin.  Apply a layer of silvadene cream over the burns to fully cover the burn then apply a clean dressing lightly over the area. You may use a loose sock to help hold in place if this does not cause pain. Be sure the dressing does not rub the area and cause breaks in the skin.    Several times a day place a frozen washcloth onto the area of the burns. Do not put the frozen wash cloth on the skin- you can place this on top of the dressing and sock or use a clean dry washcloth as a barrier to protect the skin.   Elevate your feet to help prevent swelling and additional fluid in the blisters.  In about 5-10 days, you may see the top layer of skin wrinkle and pull away- as the new skin forms underneath, this is normal.   Watch closely for any signs of redness, swelling, warmth, or pus. This can indicate infection. This will be your biggest risk due to the size of the burns and the location. Let me know if you see any of these signs.   You can take Tramadol for pain up to every 6 hours- this may make you sleepy so use caution. You can alternate Tylenol (1000 mg) and Ibuprofen (600 - 800 mg) every 4 hours also to help with the pain. The silvadene and cold packs will also help with pain.   If you have any questions, please contact me and I will get back with you as soon as possible.    Second-Degree Burn, Adult A second-degree burn, also called a partial thickness wound, is a serious injury that affects the first two layers of skin. A second-degree burn may be minor or major, depending on the size of the burn and which parts of the skin are burned. What are the causes? This condition may be caused by:  Heat. Burns caused by heat happen when skin comes in contact with something very hot,  such as a flame or hot liquid.  Radiation. Sources of radiation include sunlight, radiation used to heat food, and radiation treatments.  Electricity. Burns caused by electricity happen when electricity passes through the body from lightning, wiring, electrical outlets, appliances, or power lines.  Certain chemicals, such as acids that come in contact with the skin or eyes. Some chemicals can go through clothing. What increases the risk? This condition is more likely to occur in people who:  Are often exposed to high-risk environments, such as those with open flames, chemicals, or electricity.  Have cancer and are being treated with radiation. What are the signs or symptoms? Symptoms of this condition include:  Severe pain.  Skin that is deep red, blistered, tender, and swollen.  Skin that has changed color.  Skin that looks blotchy, wet, or shiny. How is this diagnosed? This condition is usually diagnosed with an exam of the wounded area. To get a better look at the wound, your health care provider may remove any blistered skin. It may take several days to find out if you have a second-degree burn because the signs of this kind of burn can take time to develop. You may need to watch the wound for changes at home and visit a health care provider repeatedly  to have your wound checked for changes. If the wound is large, you may need to stay in the hospital so a health care team can examine the wound for a few days. How is this treated? Treatment depends on the severity and cause of the burn. Healing may take several weeks. Some second-degree burns, including major burns, electrical burns, and chemical burns, may need to be treated in a hospital. Treatment may involve:  Cooling the burn with cool, germ-free (sterile) water.  Taking or applying medicines, such as: ? Medicines to relieve pain or itching. ? Ointments to treat or prevent infection. ? Antibiotic medicine to treat or prevent  infection.  Getting a tetanus shot.  Covering the burn with a bandage (dressing). If your fingers or toes were burned, each of them may be bandaged separately.  Compression dressings to prevent scarring and help the burned body part stay moveable.  Removing dead skin. This is done by a health care provider. Do not try to remove dead skin yourself. Deep burns can cause skin tissue to die, and a scab (eschar) may form where the skin used to be. If you have a deep burn and an eschar forms, you may need surgery to remove the eschar so the skin can heal properly. If your wound is deep and large (it covers more than 15% of your skin), treatment may also involve:  Receiving fluids and nutrition.  Close monitoring of blood flow near the wound.  Oxygen given through a mask or a machine (ventilator). This may be needed if a burn causes fluid shifts in the body that make it hard to breathe. Follow these instructions at home: Wound care   Follow instructions from your health care provider about how to take care of your wound. Make sure you: ? Wash your hands with soap and water before you change your dressing. If soap and water are not available, use hand sanitizer. ? Change your dressing as directed. ? If you have a compression dressing, wear it as directed.  Clean your wound 2-3 times a day or as often as directed. ? Wash the wound with mild soap and water. ? Rinse the wound with water to remove all soap. ? Pat the wound dry with a clean towel. Do not rub it.  Check your wound every day for signs of infection. Check for: ? More redness, swelling, or pain. ? More fluid or blood. ? Warmth. ? Pus or a bad smell. ? Yellow or green fluid.  Do not scratch or pick at the wound  Do not break any blisters or peel any skin.  Avoid exposing your wound to the sun. Medicine  Take and apply over-the-counter and prescription medicines only as told by your health care provider.  Take or apply  your antibiotic medicine as told by your health care provider. Do not stop using the antibiotic even if you start to feel better. Eating and drinking  Drink enough fluid to keep your urine clear or pale yellow.  Eat a nutritious diet that is high in protein. This will help your wound to heal. General instructions  Raise (elevate) the injured area above the level of your heart while sitting or lying down.  Rest as directed. Do not exercise until your health care provider approves.  Do not take baths, swim, or use a hot tub until your health care provider approves.  Do not put ice on your burn. This can cause more damage. Try cooling the burn with: ?  Cool water. ? A cool, wet, clean cloth (cool compress).  Keep all follow-up visits as directed. This is important. Contact a health care provider if:  Your symptoms do not improve with treatment.  Your pain is not relieved with medicine.  You have more redness, swelling, or pain around your wound.  You have more fluid or blood coming from your wound.  You have yellow or green fluid, pus, or a bad smell coming from your wound.  Your wound feels warm to the touch.  You have a fever. Get help right away if:  You develop red streaks near the wound.  You develop severe pain. Summary  A second-degree burn is a serious injury that affects the first two layers of skin.  Clean your wound 2-3 times a day or as often as directed. Check your wound every day for signs of infection.  Do not scratch or pick at your wound, break blisters, peel skin, or put ice on your burn. This information is not intended to replace advice given to you by your health care provider. Make sure you discuss any questions you have with your health care provider. Document Revised: 09/08/2017 Document Reviewed: 09/06/2016 Elsevier Patient Education  2020 [employer].
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
## 2021-01-13 ENCOUNTER — Other Ambulatory Visit: Payer: Self-pay

## 2021-01-13 ENCOUNTER — Encounter: Payer: Self-pay | Admitting: Medical-Surgical

## 2021-01-13 ENCOUNTER — Ambulatory Visit (INDEPENDENT_AMBULATORY_CARE_PROVIDER_SITE_OTHER): Payer: Commercial Managed Care - PPO | Admitting: Medical-Surgical

## 2021-01-13 VITALS — BP 107/72 | HR 101 | Temp 98.5°F | Ht 65.0 in | Wt 128.7 lb

## 2021-01-13 DIAGNOSIS — Z0000 Encounter for general adult medical examination without abnormal findings: Secondary | ICD-10-CM | POA: Diagnosis not present

## 2021-01-13 DIAGNOSIS — Z1329 Encounter for screening for other suspected endocrine disorder: Secondary | ICD-10-CM

## 2021-01-13 NOTE — Patient Instructions (Signed)
Preventive Care 21-27 Years Old, Female Preventive care refers to lifestyle choices and visits with your health care provider that can promote health and wellness. This includes:  A yearly physical exam. This is also called an annual wellness visit.  Regular dental and eye exams.  Immunizations.  Screening for certain conditions.  Healthy lifestyle choices, such as: ? Eating a healthy diet. ? Getting regular exercise. ? Not using drugs or products that contain nicotine and tobacco. ? Limiting alcohol use. What can I expect for my preventive care visit? Physical exam Your health care provider may check your:  Height and weight. These may be used to calculate your BMI (body mass index). BMI is a measurement that tells if you are at a healthy weight.  Heart rate and blood pressure.  Body temperature.  Skin for abnormal spots. Counseling Your health care provider may ask you questions about your:  Past medical problems.  Family's medical history.  Alcohol, tobacco, and drug use.  Emotional well-being.  Home life and relationship well-being.  Sexual activity.  Diet, exercise, and sleep habits.  Work and work environment.  Access to firearms.  Method of birth control.  Menstrual cycle.  Pregnancy history. What immunizations do I need? Vaccines are usually given at various ages, according to a schedule. Your health care provider will recommend vaccines for you based on your age, medical history, and lifestyle or other factors, such as travel or where you work.   What tests do I need? Blood tests  Lipid and cholesterol levels. These may be checked every 5 years starting at age 20.  Hepatitis C test.  Hepatitis B test. Screening  Diabetes screening. This is done by checking your blood sugar (glucose) after you have not eaten for a while (fasting).  STD (sexually transmitted disease) testing, if you are at risk.  BRCA-related cancer screening. This may be  done if you have a family history of breast, ovarian, tubal, or peritoneal cancers.  Pelvic exam and Pap test. This may be done every 3 years starting at age 21. Starting at age 30, this may be done every 5 years if you have a Pap test in combination with an HPV test. Talk with your health care provider about your test results, treatment options, and if necessary, the need for more tests.   Follow these instructions at home: Eating and drinking  Eat a healthy diet that includes fresh fruits and vegetables, whole grains, lean protein, and low-fat dairy products.  Take vitamin and mineral supplements as recommended by your health care provider.  Do not drink alcohol if: ? Your health care provider tells you not to drink. ? You are pregnant, may be pregnant, or are planning to become pregnant.  If you drink alcohol: ? Limit how much you have to 0-1 drink a day. ? Be aware of how much alcohol is in your drink. In the [place] one drink equals one 12 oz bottle of beer (355 mL), one 5 oz glass of wine (148 mL), or one 1 oz glass of hard liquor (44 mL).   Lifestyle  Take daily care of your teeth and gums. Brush your teeth every morning and night with fluoride toothpaste. Floss one time each day.  Stay active. Exercise for at least 30 minutes 5 or more days each week.  Do not use any products that contain nicotine or tobacco, such as cigarettes, e-cigarettes, and chewing tobacco. If you need help quitting, ask your health care provider.  Do not   use drugs.  If you are sexually active, practice safe sex. Use a condom or other form of protection to prevent STIs (sexually transmitted infections).  If you do not wish to become pregnant, use a form of birth control. If you plan to become pregnant, see your health care provider for a prepregnancy visit.  Find healthy ways to cope with stress, such as: ? Meditation, yoga, or listening to music. ? Journaling. ? Talking to a trusted  person. ? Spending time with friends and family. Safety  Always wear your seat belt while driving or riding in a vehicle.  Do not drive: ? If you have been drinking alcohol. Do not ride with someone who has been drinking. ? When you are tired or distracted. ? While texting.  Wear a helmet and other protective equipment during sports activities.  If you have firearms in your house, make sure you follow all gun safety procedures.  Seek help if you have been physically or sexually abused. What's next?  Go to your health care provider once a year for an annual wellness visit.  Ask your health care provider how often you should have your eyes and teeth checked.  Stay up to date on all vaccines. This information is not intended to replace advice given to you by your health care provider. Make sure you discuss any questions you have with your health care provider. Document Revised: 05/24/2020 Document Reviewed: 06/07/2018 Elsevier Patient Education  2021 [employer]  

## 2021-01-13 NOTE — Progress Notes (Signed)
HPI: Cynthia Carroll is a 25 y.o. female who  has a past medical history of Anxiety, Bipolar disorder ([place]), Depression, and Eclampsia affecting pregnancy.  she presents to [facility] today, 01/13/21,  for chief complaint of: Annual physical exam  Dentist: scheduling soon  Eye exam: scheduling soon, wearing blue light glasses Exercise: intermittently, jumping jacks/sit ups/run around house Diet: going well, stopped eating out as much; doesn't like much breakfast food; cooking a lot Pap smear: done last year COVID vaccine: None, discussed importance of vaccination  Concerns: None  Past medical, surgical, social and family history reviewed:  Patient Active Problem List   Diagnosis Date Noted  . Bloating 04/24/2020  . Diarrhea 04/24/2020  . Generalized abdominal pain 04/24/2020  . Family history of celiac disease 04/24/2020  . Family history of Crohn's disease 04/24/2020  . Abdominal right lower quadrant swelling 12/10/2017  . Abnormal uterine bleeding (AUB) 12/10/2017  . History of nephrolithiasis 12/04/2017  . Acute right flank pain 12/04/2017  . Postcoital bleeding 12/01/2017  . History of eclampsia 12/01/2016  . Tobacco use disorder 12/01/2016  . Mild hypertension 05/16/2016  . Bipolar disorder ([place]) 04/01/2014    Past Surgical History:  Procedure Laterality Date  . CESAREAN SECTION N/A 04/02/2016   Procedure: CESAREAN SECTION;  Surgeon: Mora Bellman, MD;  Location: [place];  Service: Obstetrics;  Laterality: N/A;    Social History   Tobacco Use  . Smoking status: Former Smoker    Packs/day: 0.50    Quit date: 01/30/2018    Years since quitting: 2.9  . Smokeless tobacco: Never Used  . Tobacco comment: pt vapes  Substance Use Topics  . Alcohol use: Yes    Comment: Occasionally    Family History  Problem Relation Age of Onset  . Heart disease Maternal Grandfather   . Celiac disease Mother   . Hypertension  Mother      Current medication list and allergy/intolerance information reviewed:    No current outpatient medications on file.   No current facility-administered medications for this visit.    No Known Allergies    Review of Systems:  Constitutional:  No  fever, no chills, No recent illness, No unintentional weight changes. No significant fatigue.   HEENT: No  headache, no vision change, no hearing change, No sore throat, No  sinus pressure  Cardiac: No  chest pain, No  pressure, No palpitations, No  Orthopnea  Respiratory:  No  shortness of breath. No  Cough  Gastrointestinal: No  abdominal pain, No  nausea, No  vomiting,  No  blood in stool, No  diarrhea, No  constipation   Musculoskeletal: No new myalgia/arthralgia  Skin: No  Rash, No other wounds/concerning lesions  Genitourinary: No  incontinence, No  abnormal genital bleeding, No abnormal genital discharge  Hem/Onc: No  easy bruising/bleeding, No  abnormal lymph node  Endocrine: No cold intolerance,  No heat intolerance. No polyuria/polydipsia/polyphagia   Neurologic: No  weakness, No  dizziness, No  slurred speech/focal weakness/facial droop  Psychiatric: No  concerns with depression, No  concerns with anxiety, No sleep problems, No mood problems  Exam:  BP 107/72   Pulse (!) 101   Temp 98.5 F (36.9 C)   Ht $R'5\' 5"'yG$  (1.651 m)   Wt 128 lb 11.2 oz (58.4 kg)   SpO2 100%   BMI 21.42 kg/m   Constitutional: VS see above. General Appearance: alert, well-developed, well-nourished, NAD  Eyes: Normal lids and conjunctive, non-icteric  sclera  Ears, Nose, Mouth, Throat: MMM, Normal external inspection ears/nares/mouth/lips/gums. TM normal bilaterally.   Neck: No masses, trachea midline. No thyroid enlargement. No tenderness/mass appreciated. No lymphadenopathy  Respiratory: Normal respiratory effort. no wheeze, no rhonchi, no rales  Cardiovascular: S1/S2 normal, no murmur, no rub/gallop auscultated. RRR. No lower  extremity edema. Pedal pulse II/IV bilaterally PT. No carotid bruit or JVD. No abdominal aortic bruit.  Gastrointestinal: Nontender, no masses. No hepatomegaly, no splenomegaly. No hernia appreciated. Bowel sounds normal. Rectal exam deferred.   Musculoskeletal: Gait normal. No clubbing/cyanosis of digits.   Neurological: Normal balance/coordination. No tremor. No cranial nerve deficit on limited exam. Motor and sensation intact and symmetric. Cerebellar reflexes intact.   Skin: warm, dry, intact. No rash/ulcer. No concerning nevi or subq nodules on limited exam.    Psychiatric: Normal judgment/insight. Normal mood and affect. Oriented x3.    No results found for this or any previous visit (from the past 72 hour(s)).  No results found.   ASSESSMENT/PLAN:   1. Annual physical exam Checking CBC with differential, CMP, and lipid panel today.  Up-to-date on preventative care as she will be scheduling a dental exam and eye exam with her new insurance. - CBC with Differential/Platelet - COMPLETE METABOLIC PANEL WITH GFR - Lipid panel  2. Screening for endocrine disorder Checking TSH today - TSH  Orders Placed This Encounter  Procedures  . CBC with Differential/Platelet  . COMPLETE METABOLIC PANEL WITH GFR  . Lipid panel  . TSH    No orders of the defined types were placed in this encounter.   Patient Instructions   Preventive Care 27-56 Years Old, Female Preventive care refers to lifestyle choices and visits with your health care provider that can promote health and wellness. This includes:  A yearly physical exam. This is also called an annual wellness visit.  Regular dental and eye exams.  Immunizations.  Screening for certain conditions.  Healthy lifestyle choices, such as: ? Eating a healthy diet. ? Getting regular exercise. ? Not using drugs or products that contain nicotine and tobacco. ? Limiting alcohol use. What can I expect for my preventive care  visit? Physical exam Your health care provider may check your:  Height and weight. These may be used to calculate your BMI (body mass index). BMI is a measurement that tells if you are at a healthy weight.  Heart rate and blood pressure.  Body temperature.  Skin for abnormal spots. Counseling Your health care provider may ask you questions about your:  Past medical problems.  Family's medical history.  Alcohol, tobacco, and drug use.  Emotional well-being.  Home life and relationship well-being.  Sexual activity.  Diet, exercise, and sleep habits.  Work and work Statistician.  Access to firearms.  Method of birth control.  Menstrual cycle.  Pregnancy history. What immunizations do I need? Vaccines are usually given at various ages, according to a schedule. Your health care provider will recommend vaccines for you based on your age, medical history, and lifestyle or other factors, such as travel or where you work.   What tests do I need? Blood tests  Lipid and cholesterol levels. These may be checked every 5 years starting at age 72.  Hepatitis C test.  Hepatitis B test. Screening  Diabetes screening. This is done by checking your blood sugar (glucose) after you have not eaten for a while (fasting).  STD (sexually transmitted disease) testing, if you are at risk.  BRCA-related cancer screening. This may be  done if you have a family history of breast, ovarian, tubal, or peritoneal cancers.  Pelvic exam and Pap test. This may be done every 3 years starting at age 31. Starting at age 62, this may be done every 5 years if you have a Pap test in combination with an HPV test. Talk with your health care provider about your test results, treatment options, and if necessary, the need for more tests.   Follow these instructions at home: Eating and drinking  Eat a healthy diet that includes fresh fruits and vegetables, whole grains, lean protein, and low-fat dairy  products.  Take vitamin and mineral supplements as recommended by your health care provider.  Do not drink alcohol if: ? Your health care provider tells you not to drink. ? You are pregnant, may be pregnant, or are planning to become pregnant.  If you drink alcohol: ? Limit how much you have to 0-1 drink a day. ? Be aware of how much alcohol is in your drink. In the [place] one drink equals one 12 oz bottle of beer (355 mL), one 5 oz glass of wine (148 mL), or one 1 oz glass of hard liquor (44 mL).   Lifestyle  Take daily care of your teeth and gums. Brush your teeth every morning and night with fluoride toothpaste. Floss one time each day.  Stay active. Exercise for at least 30 minutes 5 or more days each week.  Do not use any products that contain nicotine or tobacco, such as cigarettes, e-cigarettes, and chewing tobacco. If you need help quitting, ask your health care provider.  Do not use drugs.  If you are sexually active, practice safe sex. Use a condom or other form of protection to prevent STIs (sexually transmitted infections).  If you do not wish to become pregnant, use a form of birth control. If you plan to become pregnant, see your health care provider for a prepregnancy visit.  Find healthy ways to cope with stress, such as: ? Meditation, yoga, or listening to music. ? Journaling. ? Talking to a trusted person. ? Spending time with friends and family. Safety  Always wear your seat belt while driving or riding in a vehicle.  Do not drive: ? If you have been drinking alcohol. Do not ride with someone who has been drinking. ? When you are tired or distracted. ? While texting.  Wear a helmet and other protective equipment during sports activities.  If you have firearms in your house, make sure you follow all gun safety procedures.  Seek help if you have been physically or sexually abused. What's next?  Go to your health care provider once a year for an annual  wellness visit.  Ask your health care provider how often you should have your eyes and teeth checked.  Stay up to date on all vaccines. This information is not intended to replace advice given to you by your health care provider. Make sure you discuss any questions you have with your health care provider. Document Revised: 05/24/2020 Document Reviewed: 06/07/2018 Elsevier Patient Education  2021 [employer].  Follow-up plan: Return in about 1 year (around 01/13/2022) for annual physical exam or sooner if needed.  Clearnce Sorrel, DNP, APRN, FNP-BC [facility]

## 2021-01-14 LAB — CBC WITH DIFFERENTIAL/PLATELET
Absolute Monocytes: 398 cells/uL (ref 200–950)
Basophils Absolute: 41 cells/uL (ref 0–200)
Basophils Relative: 0.8 %
Eosinophils Absolute: 41 cells/uL (ref 15–500)
Eosinophils Relative: 0.8 %
HCT: 42.1 % (ref 35.0–45.0)
Hemoglobin: 14.1 g/dL (ref 11.7–15.5)
Lymphs Abs: 1556 cells/uL (ref 850–3900)
MCH: 30.6 pg (ref 27.0–33.0)
MCHC: 33.5 g/dL (ref 32.0–36.0)
MCV: 91.3 fL (ref 80.0–100.0)
MPV: 8.6 fL (ref 7.5–12.5)
Monocytes Relative: 7.8 %
Neutro Abs: 3065 cells/uL (ref 1500–7800)
Neutrophils Relative %: 60.1 %
Platelets: 367 10*3/uL (ref 140–400)
RBC: 4.61 10*6/uL (ref 3.80–5.10)
RDW: 11.8 % (ref 11.0–15.0)
Total Lymphocyte: 30.5 %
WBC: 5.1 10*3/uL (ref 3.8–10.8)

## 2021-01-14 LAB — COMPLETE METABOLIC PANEL WITH GFR
AG Ratio: 1.6 (calc) (ref 1.0–2.5)
ALT: 13 U/L (ref 6–29)
AST: 18 U/L (ref 10–30)
Albumin: 4.8 g/dL (ref 3.6–5.1)
Alkaline phosphatase (APISO): 57 U/L (ref 31–125)
BUN: 19 mg/dL (ref 7–25)
CO2: 28 mmol/L (ref 20–32)
Calcium: 9.4 mg/dL (ref 8.6–10.2)
Chloride: 104 mmol/L (ref 98–110)
Creat: 0.68 mg/dL (ref 0.50–1.10)
GFR, Est African American: 140 mL/min/{1.73_m2} (ref >=60)
GFR, Est Non African American: 121 mL/min/{1.73_m2} (ref >=60)
Globulin: 3 g/dL (calc) (ref 1.9–3.7)
Glucose, Bld: 83 mg/dL (ref 65–139)
Potassium: 4.5 mmol/L (ref 3.5–5.3)
Sodium: 139 mmol/L (ref 135–146)
Total Bilirubin: 0.3 mg/dL (ref 0.2–1.2)
Total Protein: 7.8 g/dL (ref 6.1–8.1)

## 2021-01-14 LAB — TSH: TSH: 2.23 mIU/L

## 2021-01-14 LAB — LIPID PANEL
Cholesterol: 203 mg/dL — ABNORMAL HIGH (ref <200)
HDL: 71 mg/dL (ref >=50)
LDL Cholesterol (Calc): 114 mg/dL (calc) — ABNORMAL HIGH
Non-HDL Cholesterol (Calc): 132 mg/dL (calc) — ABNORMAL HIGH (ref <130)
Total CHOL/HDL Ratio: 2.9 (calc) (ref <5.0)
Triglycerides: 81 mg/dL (ref <150)

## 2021-02-09 ENCOUNTER — Ambulatory Visit (INDEPENDENT_AMBULATORY_CARE_PROVIDER_SITE_OTHER): Payer: Commercial Managed Care - PPO | Admitting: Medical-Surgical

## 2021-02-09 ENCOUNTER — Other Ambulatory Visit: Payer: Self-pay

## 2021-02-09 ENCOUNTER — Encounter: Payer: Self-pay | Admitting: Medical-Surgical

## 2021-02-09 VITALS — BP 102/68 | HR 87 | Temp 98.5°F | Ht 65.0 in | Wt 130.1 lb

## 2021-02-09 DIAGNOSIS — R300 Dysuria: Secondary | ICD-10-CM | POA: Diagnosis not present

## 2021-02-09 LAB — POCT URINALYSIS DIP (CLINITEK)
Bilirubin, UA: NEGATIVE
Blood, UA: NEGATIVE
Glucose, UA: NEGATIVE mg/dL
Ketones, POC UA: NEGATIVE mg/dL
Leukocytes, UA: NEGATIVE
Nitrite, UA: NEGATIVE
POC PROTEIN,UA: NEGATIVE
Spec Grav, UA: 1.025 (ref 1.010–1.025)
Urobilinogen, UA: 0.2 E.U./dL
pH, UA: 6 (ref 5.0–8.0)

## 2021-02-09 NOTE — Progress Notes (Signed)
Subjective:    CC: Possible UTI  HPI: Pleasant 25 year old female presenting today for evaluation of a possible UTI.  Notes that over the weekend she had 3 days that she woke up with her right mid back aching, rated 3/10.  She did have some pink tinge on the toilet paper when wiping in the morning but this resolved each day.  She had no symptoms yesterday but this morning she returned to having pink tinge on the toilet paper. No urinary burning, hesitancy, frequency, urgency, or odor. She usually drinks 5-6 bottles of water daily but has recently noted she is only getting through about 3 bottles a day.  Since she has a history of kidney issues, she is worried and wants to make sure that there is no kidney infection or UTI going on.  I reviewed the past medical history, family history, social history, surgical history, and allergies today and no changes were needed.  Please see the problem list section below in epic for further details.  Past Medical History: Past Medical History:  Diagnosis Date  . Anxiety   . Bipolar disorder ([facility])   . Depression   . Eclampsia affecting pregnancy    Past Surgical History: Past Surgical History:  Procedure Laterality Date  . CESAREAN SECTION N/A 04/02/2016   Procedure: CESAREAN SECTION;  Surgeon: Catalina Antigua, MD;  Location: [facility];  Service: Obstetrics;  Laterality: N/A;   Social History: Social History   Socioeconomic History  . Marital status: Single    Spouse name: Not on file  . Number of children: Not on file  . Years of education: Not on file  . Highest education level: Not on file  Occupational History  . Not on file  Tobacco Use  . Smoking status: Former Smoker    Packs/day: 0.50    Quit date: 01/30/2018    Years since quitting: 3.0  . Smokeless tobacco: Never Used  . Tobacco comment: pt vapes  Vaping Use  . Vaping Use: Every day  . Substances: Nicotine  Substance and Sexual Activity  . Alcohol use: Yes    Comment:  Occasionally  . Drug use: No  . Sexual activity: Yes    Partners: Male    Birth control/protection: Condom  Other Topics Concern  . Not on file  Social History Narrative  . Not on file   Social Determinants of Health   Financial Resource Strain: Not on file  Food Insecurity: Not on file  Transportation Needs: Not on file  Physical Activity: Not on file  Stress: Not on file  Social Connections: Not on file   Family History: Family History  Problem Relation Age of Onset  . Heart disease Maternal Grandfather   . Celiac disease Mother   . Hypertension Mother    Allergies: No Known Allergies Medications: See med rec.  Review of Systems: See HPI for pertinent positives and negatives.   Objective:    General: Well Developed, well nourished, and in no acute distress.  Neuro: Alert and oriented x3.  HEENT: Normocephalic, atraumatic.  Skin: Warm and dry. Cardiac: Regular rate and rhythm, no murmurs rubs or gallops, no lower extremity edema.  Respiratory: Clear to auscultation bilaterally. Not using accessory muscles, speaking in full sentences. Abdomen: Soft, mild LLQ/SP tenderness, nondistended. Bowel sounds + x 4 quadrants. No HSM appreciated.  No CVA tenderness.  Impression and Recommendations:    1. Dysuria POCT UA negative.  No hematuria so low suspicion of recurrent kidney stone.  Since  her back pain has resolved today, this may be more related to sleeping position.  Offered to have kidney function checked but she would like to hold off on this.  Advised to increase fluid intake since dehydration can cause symptoms.  If no improvement by next week, she will return for further evaluation. - POCT URINALYSIS DIP (CLINITEK)  Return if symptoms worsen or fail to improve. ___________________________________________ Thayer Ohm, DNP, APRN, FNP-BC Primary Care and Sports Medicine [facility]

## 2021-02-11 ENCOUNTER — Ambulatory Visit: Payer: Commercial Managed Care - PPO | Admitting: Medical-Surgical

## 2021-04-06 ENCOUNTER — Other Ambulatory Visit: Payer: Self-pay

## 2021-04-06 ENCOUNTER — Encounter: Payer: Self-pay | Admitting: Medical-Surgical

## 2021-04-06 ENCOUNTER — Ambulatory Visit (INDEPENDENT_AMBULATORY_CARE_PROVIDER_SITE_OTHER): Payer: Commercial Managed Care - PPO | Admitting: Medical-Surgical

## 2021-04-06 VITALS — BP 102/70 | HR 103 | Temp 98.5°F | Ht 65.0 in | Wt 131.4 lb

## 2021-04-06 DIAGNOSIS — K130 Diseases of lips: Secondary | ICD-10-CM

## 2021-04-06 MED ORDER — DOXYCYCLINE HYCLATE 100 MG PO TABS: 100.0000 mg | ORAL_TABLET | Freq: Two times a day (BID) | ORAL | 0 refills | Status: AC

## 2021-04-06 NOTE — Progress Notes (Signed)
Subjective:    CC: Lip swelling  HPI: Pleasant 25 year old female presenting today for evaluation of left upper lip swelling.  Notes that she had a small lump that developed under the skin on the left upper lip a few weeks ago.  She felt like it was a little ball that was deep under the skin.  She decided to leave it alone and let it resolve on its own and notes that it was getting some better.  Unfortunately this morning she got up and her left upper lip was quite swollen and numb in the area of swelling.  Has had some pain and tenderness since the onset and notes that this is very aggravating.  Originally unable to see a head but today has noticed that a small pimple-like area is visible.  She has not manipulated or squeezed it.  Has not used heat or over-the-counter remedies.  She did use cold which was somewhat helpful but only temporarily.  Denies fever, chills, myalgias, exposures to potential allergens, difficulty breathing, coughing/wheezing, and difficulty swallowing.  I reviewed the past medical history, family history, social history, surgical history, and allergies today and no changes were needed.  Please see the problem list section below in epic for further details.  Past Medical History: Past Medical History:  Diagnosis Date   Anxiety    Bipolar disorder ([facility])    Depression    Eclampsia affecting pregnancy    Past Surgical History: Past Surgical History:  Procedure Laterality Date   CESAREAN SECTION N/A 04/02/2016   Procedure: CESAREAN SECTION;  Surgeon: Catalina Antigua, MD;  Location: [facility];  Service: Obstetrics;  Laterality: N/A;   Social History: Social History   Socioeconomic History   Marital status: Single    Spouse name: Not on file   Number of children: Not on file   Years of education: Not on file   Highest education level: Not on file  Occupational History   Not on file  Tobacco Use   Smoking status: Former    Packs/day: 0.50    Pack years:  0.00    Types: Cigarettes    Quit date: 01/30/2018    Years since quitting: 3.1   Smokeless tobacco: Never   Tobacco comments:    pt vapes  Vaping Use   Vaping Use: Every day   Substances: Nicotine  Substance and Sexual Activity   Alcohol use: Yes    Comment: Occasionally   Drug use: No   Sexual activity: Yes    Partners: Male    Birth control/protection: Condom  Other Topics Concern   Not on file  Social History Narrative   Not on file   Social Determinants of Health   Financial Resource Strain: Not on file  Food Insecurity: Not on file  Transportation Needs: Not on file  Physical Activity: Not on file  Stress: Not on file  Social Connections: Not on file   Family History: Family History  Problem Relation Age of Onset   Heart disease Maternal Grandfather    Celiac disease Mother    Hypertension Mother    Allergies: No Known Allergies Medications: See med rec.  Review of Systems: See HPI for pertinent positives and negatives.   Objective:    General: Well Developed, well nourished, and in no acute distress.  Neuro: Alert and oriented x3. HEENT: Normocephalic, atraumatic.  Visibly swollen area without erythema to the left upper lip.  Small white 1 mm papule visible at the lower edge of the  swelling near the border of the lip.  No drainage noted at time of exam. Skin: Warm and dry. Cardiac: Regular rate and rhythm, no murmurs rubs or gallops, no lower extremity edema.  Respiratory: Clear to auscultation bilaterally. Not using accessory muscles, speaking in full sentences.  Impression and Recommendations:    1. Lip abscess Discussed options with patient.  We can go with incision and drainage or conservative treatment.  Patient would prefer to go with conservative treatment at this point.  Sending in doxycycline 100 mg twice daily x7 days.  Recommend using warm compresses/heating pad to the area at least 4-5 times daily for 10 to 15 minutes.  This will likely drawl  the abscess to ahead and she may start to experience drainage.  Recommend allowing the area to drain and avoid excessive manipulating of the area.  Tylenol/ibuprofen for pain/discomfort.  If the swelling has not come to ahead or started draining after 48 hours of conservative treatment, recommend returning for incision and drainage of the area.  Return if symptoms worsen or fail to improve. ___________________________________________ Thayer Ohm, DNP, APRN, FNP-BC Primary Care and Sports Medicine [facility] [place]

## 2021-04-07 ENCOUNTER — Encounter: Payer: Self-pay | Admitting: Medical-Surgical

## 2021-04-09 ENCOUNTER — Encounter: Payer: Self-pay | Admitting: Medical-Surgical

## 2021-04-09 ENCOUNTER — Other Ambulatory Visit: Payer: Self-pay

## 2021-04-09 ENCOUNTER — Ambulatory Visit (INDEPENDENT_AMBULATORY_CARE_PROVIDER_SITE_OTHER): Payer: Commercial Managed Care - PPO | Admitting: Medical-Surgical

## 2021-04-09 VITALS — BP 108/73 | HR 89 | Temp 98.6°F | Ht 65.0 in | Wt 132.0 lb

## 2021-04-09 DIAGNOSIS — K130 Diseases of lips: Secondary | ICD-10-CM

## 2021-04-09 MED ORDER — CHLORHEXIDINE GLUCONATE 0.12 % MT SOLN: 15.0000 mL | Freq: Two times a day (BID) | OROMUCOSAL | 0 refills | Status: DC

## 2021-04-09 NOTE — Progress Notes (Signed)
Subjective:    CC: lip abscess follow up  HPI: Pleasant 25 year old female presenting today for follow-up on lip abscess.  She was started on doxycycline 100 mg twice daily and is tolerating that well.  Taking medication as prescribed and has not missed any doses.  The day after starting the medication and warm compresses she noticed a significant increase in swelling.  She did send a MyChart message with concerned that it had enlarged.  Since then she has continued the medication and warm compresses multiple times daily.  Has noted a decrease in swelling and discomfort.  The area is no longer numb.  She does have a small red area that has developed at the edge of the initial abscess.  Has not had any draining and has not noticed a head coming up on the left or on the inner aspect of the lip. No fevers, chills, or myalgias. Of note, the lip abscess started the day after going to [employer].   I reviewed the past medical history, family history, social history, surgical history, and allergies today and no changes were needed.  Please see the problem list section below in epic for further details.  Past Medical History: Past Medical History:  Diagnosis Date   Anxiety    Bipolar disorder (HCC)    Depression    Eclampsia affecting pregnancy    Past Surgical History: Past Surgical History:  Procedure Laterality Date   CESAREAN SECTION N/A 04/02/2016   Procedure: CESAREAN SECTION;  Surgeon: Catalina Antigua, MD;  Location: [facility];  Service: Obstetrics;  Laterality: N/A;   Social History: Social History   Socioeconomic History   Marital status: Single    Spouse name: Not on file   Number of children: Not on file   Years of education: Not on file   Highest education level: Not on file  Occupational History   Not on file  Tobacco Use   Smoking status: Former    Packs/day: 0.50    Pack years: 0.00    Types: Cigarettes    Quit date: 01/30/2018    Years since  quitting: 3.1   Smokeless tobacco: Never   Tobacco comments:    pt vapes  Vaping Use   Vaping Use: Every day   Substances: Nicotine  Substance and Sexual Activity   Alcohol use: Yes    Comment: Occasionally   Drug use: No   Sexual activity: Yes    Partners: Male    Birth control/protection: Condom  Other Topics Concern   Not on file  Social History Narrative   Not on file   Social Determinants of Health   Financial Resource Strain: Not on file  Food Insecurity: Not on file  Transportation Needs: Not on file  Physical Activity: Not on file  Stress: Not on file  Social Connections: Not on file   Family History: Family History  Problem Relation Age of Onset   Heart disease Maternal Grandfather    Celiac disease Mother    Hypertension Mother    Allergies: No Known Allergies Medications: See med rec.  Review of Systems: See HPI for pertinent positives and negatives.   Objective:    General: Well Developed, well nourished, and in no acute distress.  Neuro: Alert and oriented x3.  HEENT: Normocephalic, atraumatic.  Skin: Warm and dry. Mild swelling remains to the left upper lip with a focused area of erythema, indurated without fluctuance.  Cardiac: Regular rate and rhythm, no murmurs rubs  or gallops, no lower extremity edema.  Respiratory: Clear to auscultation bilaterally. Not using accessory muscles, speaking in full sentences.  Impression and Recommendations:    1. Lip abscess Continue Doxycycline 100mg  BID until prescription finished. No area of fluctuance at the site and no "head" to indicate need for draining. Continue warm compresses and monitor for worsening of swelling, redness, etc. Avoid swimming in public pools. Sending in Chlorhexidine mouthwash for BID use should the site open up and drain into the oral cavity.   Return if symptoms worsen or fail to improve.  ___________________________________________ , DNP, APRN, FNP-BC Primary Care  and Sports Medicine [facility] [place]

## 2021-07-19 ENCOUNTER — Other Ambulatory Visit: Payer: Self-pay

## 2021-07-19 ENCOUNTER — Emergency Department (HOSPITAL_BASED_OUTPATIENT_CLINIC_OR_DEPARTMENT_OTHER)
Admission: EM | Admit: 2021-07-19 | Discharge: 2021-07-19 | Disposition: A | Discharge status: Home / Self Care | Payer: Commercial Managed Care - PPO | Attending: Emergency Medicine | Admitting: Emergency Medicine

## 2021-07-19 ENCOUNTER — Emergency Department (HOSPITAL_BASED_OUTPATIENT_CLINIC_OR_DEPARTMENT_OTHER): Payer: Commercial Managed Care - PPO

## 2021-07-19 ENCOUNTER — Encounter (HOSPITAL_BASED_OUTPATIENT_CLINIC_OR_DEPARTMENT_OTHER): Payer: Self-pay | Admitting: Emergency Medicine

## 2021-07-19 DIAGNOSIS — M79602 Pain in left arm: Secondary | ICD-10-CM | POA: Insufficient documentation

## 2021-07-19 DIAGNOSIS — R079 Chest pain, unspecified: Secondary | ICD-10-CM | POA: Diagnosis not present

## 2021-07-19 DIAGNOSIS — Z5321 Procedure and treatment not carried out due to patient leaving prior to being seen by health care provider: Secondary | ICD-10-CM | POA: Diagnosis not present

## 2021-07-19 LAB — BASIC METABOLIC PANEL
Anion gap: 11 (ref 5–15)
BUN: 17 mg/dL (ref 6–20)
CO2: 26 mmol/L (ref 22–32)
Calcium: 9.8 mg/dL (ref 8.9–10.3)
Chloride: 99 mmol/L (ref 98–111)
Creatinine, Ser: 0.61 mg/dL (ref 0.44–1.00)
GFR, Estimated: >60 mL/min (ref >60)
Glucose, Bld: 99 mg/dL (ref 70–99)
Potassium: 3.4 mmol/L — ABNORMAL LOW (ref 3.5–5.1)
Sodium: 136 mmol/L (ref 135–145)

## 2021-07-19 LAB — PREGNANCY, URINE: Preg Test, Ur: NEGATIVE

## 2021-07-19 LAB — CBC
HCT: 41.7 % (ref 36.0–46.0)
Hemoglobin: 14.4 g/dL (ref 12.0–15.0)
MCH: 31.1 pg (ref 26.0–34.0)
MCHC: 34.5 g/dL (ref 30.0–36.0)
MCV: 90.1 fL (ref 80.0–100.0)
Platelets: 409 10*3/uL — ABNORMAL HIGH (ref 150–400)
RBC: 4.63 MIL/uL (ref 3.87–5.11)
RDW: 11.5 % (ref 11.5–15.5)
WBC: 6.6 10*3/uL (ref 4.0–10.5)
nRBC: 0 % (ref 0.0–0.2)

## 2021-07-19 LAB — TROPONIN I (HIGH SENSITIVITY): Troponin I (High Sensitivity): <2 ng/L (ref <18)

## 2021-07-19 NOTE — ED Triage Notes (Signed)
Pt arrives pov with c/o left side CP x 1 week, Left arm pain "for months". Pt denies shob. Pt endorses arm pain dx as carpal tunnel

## 2021-07-20 ENCOUNTER — Encounter: Payer: Self-pay | Admitting: Medical-Surgical

## 2021-07-20 ENCOUNTER — Telehealth: Payer: Self-pay

## 2021-07-20 NOTE — Telephone Encounter (Signed)
Transition Care Management Follow-up Telephone Call Date of discharge and from where: 07/19/2021 from [facility] How have you been since you were released from the hospital? Pt stated that she is still experiencing the discomfort and it is located on the left side. Pt states that it has gotten a little better since the visit to the ED yesterday. Pt stated that she was able to walked at the track this morning without any increase in chest discomfort.  Any questions or concerns? No  Items Reviewed: Did the pt receive and understand the discharge instructions provided? Yes  Medications obtained and verified? Yes  Other? No  Any new allergies since your discharge? No  Dietary orders reviewed? No Do you have support at home? Yes   Functional Questionnaire: (I = Independent and D = Dependent) ADLs: I  Bathing/Dressing- I  Meal Prep- I  Eating- I  Maintaining continence- I  Transferring/Ambulation- I  Managing Meds- I  Follow up appointments reviewed: PCP Hospital f/u appt confirmed? Yes  Scheduled to see Christen Butter, NP on 07/23/2021 @ 9:50am. Specialist Hospital f/u appt confirmed? No   Are transportation arrangements needed? No  If their condition worsens, is the pt aware to call PCP or go to the Emergency Dept.? Yes Was the patient provided with contact information for the PCP's office or ED? Yes Was to pt encouraged to call back with questions or concerns? Yes

## 2021-07-23 ENCOUNTER — Encounter: Payer: Self-pay | Admitting: Medical-Surgical

## 2021-07-23 ENCOUNTER — Telehealth (INDEPENDENT_AMBULATORY_CARE_PROVIDER_SITE_OTHER): Payer: Self-pay | Admitting: Medical-Surgical

## 2021-07-23 DIAGNOSIS — Z91199 Patient's noncompliance with other medical treatment and regimen due to unspecified reason: Secondary | ICD-10-CM

## 2021-07-23 NOTE — Progress Notes (Signed)
No show for appointment. 3 phone call attempts made with VMs. Email and text links sent to patient by MA. Separate text link sent to patient by provider. Waited 15 minutes for patient in video chat.   Thayer Ohm, DNP, APRN, FNP-BC [facility]

## 2021-07-23 NOTE — Progress Notes (Signed)
Called at 9:40, 9:45, 9:50 no answer Direct links sent via text and email

## 2021-07-26 ENCOUNTER — Emergency Department (INDEPENDENT_AMBULATORY_CARE_PROVIDER_SITE_OTHER)
Admission: EM | Admit: 2021-07-26 | Discharge: 2021-07-26 | Disposition: A | Discharge status: Home / Self Care | Payer: Commercial Managed Care - PPO | Source: Home / Self Care

## 2021-07-26 ENCOUNTER — Other Ambulatory Visit: Payer: Self-pay

## 2021-07-26 DIAGNOSIS — S39012A Strain of muscle, fascia and tendon of lower back, initial encounter: Secondary | ICD-10-CM

## 2021-07-26 LAB — POCT URINALYSIS DIP (MANUAL ENTRY)
Bilirubin, UA: NEGATIVE
Blood, UA: NEGATIVE
Glucose, UA: NEGATIVE mg/dL
Ketones, POC UA: NEGATIVE mg/dL
Leukocytes, UA: NEGATIVE
Nitrite, UA: NEGATIVE
Protein Ur, POC: NEGATIVE mg/dL
Spec Grav, UA: 1.01 (ref 1.010–1.025)
Urobilinogen, UA: 0.2 E.U./dL
pH, UA: 7 (ref 5.0–8.0)

## 2021-07-26 MED ORDER — IBUPROFEN 600 MG PO TABS
600.0000 mg | ORAL_TABLET | Freq: Three times a day (TID) | ORAL | 0 refills | Status: AC | PRN
Start: 2021-07-26 — End: 2021-08-05

## 2021-07-26 MED ORDER — BACLOFEN 10 MG PO TABS
10.0000 mg | ORAL_TABLET | Freq: Three times a day (TID) | ORAL | 0 refills | Status: DC
Start: 2021-07-26 — End: 2022-12-18

## 2021-07-26 NOTE — ED Triage Notes (Signed)
Pt c/o lower back pain since last night. Denies urinary sxs. Hx of kidney stones. Pain 7/10

## 2021-07-26 NOTE — ED Provider Notes (Signed)
Cynthia Carroll [facility]    CSN: [id number] Arrival date & time: 07/26/21  1517      History   Chief Complaint Chief Complaint  Patient presents with   Back Pain    Lower    HPI Cynthia Carroll is a 25 y.o. female.   HPI 25 year old female presents with lower back pain since last night.  Denies urinary symptoms.  Reports history of kidney stones and currently rates back pain as 7 of 10.  PMH significant for history of nephrolithiasis and acute right flank pain.  Patient is accompanied by her significant other this morning and reports playing on trampoline jumping up and down for 15-20 minutes with children last night.  Past Medical History:  Diagnosis Date   Anxiety    Bipolar disorder (HCC)    Depression    Eclampsia affecting pregnancy     Patient Active Problem List   Diagnosis Date Noted   Bloating 04/24/2020   Diarrhea 04/24/2020   Generalized abdominal pain 04/24/2020   Family history of celiac disease 04/24/2020   Family history of Crohn's disease 04/24/2020   Abdominal right lower quadrant swelling 12/10/2017   Abnormal uterine bleeding (AUB) 12/10/2017   History of nephrolithiasis 12/04/2017   Acute right flank pain 12/04/2017   Postcoital bleeding 12/01/2017   History of eclampsia 12/01/2016   Tobacco use disorder 12/01/2016   Mild hypertension 05/16/2016   Bipolar disorder (HCC) 04/01/2014    Past Surgical History:  Procedure Laterality Date   CESAREAN SECTION N/A 04/02/2016   Procedure: CESAREAN SECTION;  Surgeon: Catalina Antigua, MD;  Location: [facility];  Service: Obstetrics;  Laterality: N/A;    OB History     Gravida  1   Para  1   Term      Preterm  1   AB      Living  1      SAB      IAB      Ectopic      Multiple  0   Live Births  1            Home Medications    Prior to Admission medications   Medication Sig Start Date End Date Taking? Authorizing Provider  baclofen (LIORESAL) 10 MG tablet Take 1  tablet (10 mg total) by mouth 3 (three) times daily. 07/26/21  Yes Trevor Iha, FNP  ibuprofen (ADVIL) 600 MG tablet Take 1 tablet (600 mg total) by mouth every 8 (eight) hours as needed for up to 10 days. 07/26/21 08/05/21 Yes Trevor Iha, FNP  chlorhexidine (PERIDEX) 0.12 % solution Use as directed 15 mLs in the mouth or throat 2 (two) times daily. 04/09/21   Christen Butter, NP    Family History Family History  Problem Relation Age of Onset   Heart disease Maternal Grandfather    Celiac disease Mother    Hypertension Mother     Social History Social History   Tobacco Use   Smoking status: Former    Packs/day: 0.50    Types: Cigarettes    Quit date: 01/30/2018    Years since quitting: 3.4   Smokeless tobacco: Never   Tobacco comments:    pt vapes  Vaping Use   Vaping Use: Every day   Substances: Nicotine  Substance Use Topics   Alcohol use: Yes    Comment: Occasionally   Drug use: No     Allergies   Patient has no known allergies.   Review of Systems  Review of Systems  HENT:  Positive for sinus pain.   Musculoskeletal:  Positive for back pain.    Physical Exam Triage Vital Signs ED Triage Vitals  Enc Vitals Group     BP      Pulse      Resp      Temp      Temp src      SpO2      Weight      Height      Head Circumference      Peak Flow      Pain Score      Pain Loc      Pain Edu?      Excl. in GC?    No data found.  Updated Vital Signs BP 115/82 (BP Location: Left Arm)   Pulse 91   Temp 98.2 F (36.8 C) (Oral)   Resp 17   LMP 07/11/2021   SpO2 94%    Physical Exam Vitals and nursing note reviewed.  Constitutional:      General: She is not in acute distress.    Appearance: Normal appearance. She is normal weight. She is not ill-appearing.  HENT:     Head: Normocephalic and atraumatic.     Mouth/Throat:     Mouth: Mucous membranes are moist.     Pharynx: Oropharynx is clear.  Eyes:     Extraocular Movements: Extraocular movements  intact.     Conjunctiva/sclera: Conjunctivae normal.     Pupils: Pupils are equal, round, and reactive to light.  Cardiovascular:     Rate and Rhythm: Normal rate and regular rhythm.     Pulses: Normal pulses.     Heart sounds: Normal heart sounds.  Pulmonary:     Effort: Pulmonary effort is normal.     Breath sounds: Normal breath sounds.     Comments: No adventitious breath sounds noted Abdominal:     Tenderness: There is no right CVA tenderness or left CVA tenderness.  Musculoskeletal:        General: Normal range of motion.     Cervical back: Normal range of motion and neck supple.     Comments: Lower back (right-sided): TTP over superior spinal erector, external oblique, deformity noted  Skin:    General: Skin is warm and dry.  Neurological:     General: No focal deficit present.     Mental Status: She is alert and oriented to person, place, and time. Mental status is at baseline.  Psychiatric:        Mood and Affect: Mood normal.        Behavior: Behavior normal.     UC Treatments / Results  Labs (all labs ordered are listed, but only abnormal results are displayed) Labs Reviewed  POCT URINALYSIS DIP (MANUAL ENTRY)    EKG   Radiology No results found.  Procedures Procedures (including critical care time)  Medications Ordered in UC Medications - No data to display  Initial Impression / Assessment and Plan / UC Course  I have reviewed the triage vital signs and the nursing notes.  Pertinent labs & imaging results that were available during my care of the patient were reviewed by me and considered in my medical decision making (see chart for details).     MDM: 1. Strain of lumbar region, initial encounter-Rx'd Ibuprofen and Baclofen.  Advised/instructed patient to take medication as directed with food.  Advised patient may use either Ibuprofen 600 mg and/or Baclofen 10 mg  daily or as needed for lumbar strain.  Advised/instructed patient to avoid moderate to  strenuous activities involving affected area of right lower/mid back for the next 7 -10 days.  Encouraged patient to increase daily water intake while taking these medications. Final Clinical Impressions(s) / UC Diagnoses   Final diagnoses:  Strain of lumbar region, initial encounter     Discharge Instructions      Advised/instructed patient to take medication as directed with food.  Advised patient may use either Ibuprofen 600 mg and/or Baclofen 10 mg daily or as needed for lumbar strain.  Advised/instructed patient to avoid moderate to strenuous activities involving affected area of right lower/mid back for the next 7 -10 days.  Encouraged patient to increase daily water intake while taking these medications.     ED Prescriptions     Medication Sig Dispense Auth. Provider   ibuprofen (ADVIL) 600 MG tablet Take 1 tablet (600 mg total) by mouth every 8 (eight) hours as needed for up to 10 days. 30 tablet Trevor Iha, FNP   baclofen (LIORESAL) 10 MG tablet Take 1 tablet (10 mg total) by mouth 3 (three) times daily. 30 each Trevor Iha, FNP      PDMP not reviewed this encounter.   Trevor Iha, FNP 07/26/21 [phone]

## 2021-07-26 NOTE — Discharge Instructions (Addendum)
Advised/instructed patient to take medication as directed with food.  Advised patient may use either Ibuprofen 600 mg and/or Baclofen 10 mg daily or as needed for lumbar strain.  Advised/instructed patient to avoid moderate to strenuous activities involving affected area of right lower/mid back for the next 7 -10 days.  Encouraged patient to increase daily water intake while taking these medications.

## 2021-07-29 ENCOUNTER — Telehealth: Payer: Commercial Managed Care - PPO | Admitting: Medical-Surgical

## 2021-09-06 ENCOUNTER — Encounter: Payer: Self-pay | Admitting: Medical-Surgical

## 2021-09-07 ENCOUNTER — Ambulatory Visit (INDEPENDENT_AMBULATORY_CARE_PROVIDER_SITE_OTHER): Payer: Self-pay | Admitting: Medical-Surgical

## 2021-09-07 DIAGNOSIS — Z91199 Patient's noncompliance with other medical treatment and regimen due to unspecified reason: Secondary | ICD-10-CM

## 2021-09-07 NOTE — Progress Notes (Signed)
   Complete physical exam  Patient: Morgan A Kirkman   DOB: 07/30/2001   25 y.o. Female  MRN: [id number]  Subjective:    No chief complaint on file.   Morgan A Kirkman is a 25 y.o. female who presents today for a complete physical exam. She reports consuming a {diet types:17450} diet. {types:19826} She generally feels {DESC; WELL/FAIRLY WELL/POORLY:18703}. She reports sleeping {DESC; WELL/FAIRLY WELL/POORLY:18703}. She {does/does not:200015} have additional problems to discuss today.    Most recent fall risk assessment:    04/06/2022   10:42 AM  Fall Risk   Falls in the past year? 0  Number falls in past yr: 0  Injury with Fall? 0  Risk for fall due to : No Fall Risks  Follow up Falls evaluation completed     Most recent depression screenings:    04/06/2022   10:42 AM 02/25/2021   10:46 AM  PHQ 2/9 Scores  PHQ - 2 Score 0 0  PHQ- 9 Score 5     {VISON DENTAL STD PSA (Optional):27386}  {History (Optional):23778}  Patient Care Team: Arica Bevilacqua, NP as PCP - General (Nurse Practitioner)   Outpatient Medications Prior to Visit  Medication Sig   fluticasone (FLONASE) 50 MCG/ACT nasal spray Place 2 sprays into both nostrils in the morning and at bedtime. After 7 days, reduce to once daily.   norgestimate-ethinyl estradiol (SPRINTEC 28) 0.25-35 MG-MCG tablet Take 1 tablet by mouth daily.   Nystatin POWD Apply liberally to affected area 2 times per day   spironolactone (ALDACTONE) 100 MG tablet Take 1 tablet (100 mg total) by mouth daily.   No facility-administered medications prior to visit.    ROS        Objective:     There were no vitals taken for this visit. {Vitals History (Optional):23777}  Physical Exam   No results found for any visits on 05/12/22. {Show previous labs (optional):23779}    Assessment & Plan:    Routine Health Maintenance and Physical Exam  Immunization History  Administered Date(s) Administered   DTaP 10/13/1999, 12/09/1999,  02/17/2000, 11/02/2000, 05/18/2004   Hepatitis A 03/14/2008, 03/20/2009   Hepatitis B 07/31/1999, 09/07/1999, 02/17/2000   HiB (PRP-OMP) 10/13/1999, 12/09/1999, 02/17/2000, 11/02/2000   IPV 10/13/1999, 12/09/1999, 08/07/2000, 05/18/2004   Influenza,inj,Quad PF,6+ Mos 06/20/2014   Influenza-Unspecified 09/19/2012   MMR 08/07/2001, 05/18/2004   Meningococcal Polysaccharide 03/19/2012   Pneumococcal Conjugate-13 11/02/2000   Pneumococcal-Unspecified 02/17/2000, 05/02/2000   Tdap 03/19/2012   Varicella 08/07/2000, 03/14/2008    Health Maintenance  Topic Date Due   HIV Screening  Never done   Hepatitis C Screening  Never done   INFLUENZA VACCINE  05/10/2022   PAP-Cervical Cytology Screening  05/12/2022 (Originally 07/29/2020)   PAP SMEAR-Modifier  05/12/2022 (Originally 07/29/2020)   TETANUS/TDAP  05/12/2022 (Originally 03/19/2022)   HPV VACCINES  Discontinued   COVID-19 Vaccine  Discontinued    Discussed health benefits of physical activity, and encouraged her to engage in regular exercise appropriate for her age and condition.  Problem List Items Addressed This Visit   None Visit Diagnoses     Annual physical exam    -  Primary   Cervical cancer screening       Need for Tdap vaccination          No follow-ups on file.     Rayyan Burley, NP   

## 2022-09-07 LAB — OB RESULTS CONSOLE HIV ANTIBODY (ROUTINE TESTING): HIV: NONREACTIVE

## 2022-09-07 LAB — OB RESULTS CONSOLE RPR: RPR: NONREACTIVE

## 2022-09-07 LAB — HEPATITIS C ANTIBODY: HCV Ab: NEGATIVE

## 2022-09-07 LAB — OB RESULTS CONSOLE HEPATITIS B SURFACE ANTIGEN: Hepatitis B Surface Ag: NEGATIVE

## 2022-09-07 LAB — OB RESULTS CONSOLE GC/CHLAMYDIA
Chlamydia: NEGATIVE
Neisseria Gonorrhea: NEGATIVE

## 2022-09-07 LAB — OB RESULTS CONSOLE RUBELLA ANTIBODY, IGM: Rubella: IMMUNE

## 2022-12-18 ENCOUNTER — Inpatient Hospital Stay (HOSPITAL_COMMUNITY)
Admission: AD | Admit: 2022-12-18 | Discharge: 2022-12-18 | Disposition: A | Discharge status: Home / Self Care | Payer: Commercial Managed Care - PPO | Attending: Obstetrics and Gynecology | Admitting: Obstetrics and Gynecology

## 2022-12-18 ENCOUNTER — Encounter: Payer: Self-pay | Admitting: Student

## 2022-12-18 DIAGNOSIS — F17290 Nicotine dependence, other tobacco product, uncomplicated: Secondary | ICD-10-CM | POA: Diagnosis not present

## 2022-12-18 DIAGNOSIS — Z7982 Long term (current) use of aspirin: Secondary | ICD-10-CM | POA: Insufficient documentation

## 2022-12-18 DIAGNOSIS — R102 Pelvic and perineal pain: Secondary | ICD-10-CM | POA: Insufficient documentation

## 2022-12-18 DIAGNOSIS — Z79899 Other long term (current) drug therapy: Secondary | ICD-10-CM | POA: Diagnosis not present

## 2022-12-18 DIAGNOSIS — O26892 Other specified pregnancy related conditions, second trimester: Secondary | ICD-10-CM | POA: Insufficient documentation

## 2022-12-18 DIAGNOSIS — Z3492 Encounter for supervision of normal pregnancy, unspecified, second trimester: Secondary | ICD-10-CM

## 2022-12-18 DIAGNOSIS — R109 Unspecified abdominal pain: Secondary | ICD-10-CM | POA: Insufficient documentation

## 2022-12-18 DIAGNOSIS — O99332 Smoking (tobacco) complicating pregnancy, second trimester: Secondary | ICD-10-CM | POA: Diagnosis not present

## 2022-12-18 DIAGNOSIS — Z3A24 24 weeks gestation of pregnancy: Secondary | ICD-10-CM | POA: Diagnosis not present

## 2022-12-18 LAB — WET PREP, GENITAL
Clue Cells Wet Prep HPF POC: NONE SEEN
Sperm: NONE SEEN
Trich, Wet Prep: NONE SEEN
WBC, Wet Prep HPF POC: <10 (ref <10)
Yeast Wet Prep HPF POC: NONE SEEN

## 2022-12-18 LAB — URINALYSIS, ROUTINE W REFLEX MICROSCOPIC
Bilirubin Urine: NEGATIVE
Glucose, UA: NEGATIVE mg/dL
Hgb urine dipstick: NEGATIVE
Ketones, ur: NEGATIVE mg/dL
Leukocytes,Ua: NEGATIVE
Nitrite: NEGATIVE
Protein, ur: NEGATIVE mg/dL
Specific Gravity, Urine: 1.003 — ABNORMAL LOW (ref 1.005–1.030)
pH: 7 (ref 5.0–8.0)

## 2022-12-18 LAB — AMNISURE RUPTURE OF MEMBRANE (ROM) NOT AT ARMC: Amnisure ROM: NEGATIVE

## 2022-12-18 NOTE — MAU Note (Signed)
RN called lab to inquire about Wet Prep and Amnisure. Lab tech, Anderson Malta, answered and informed RN that that they will try to locate the sample and contact the RN if they are unable to locate.Marland Kitchen

## 2022-12-18 NOTE — MAU Note (Signed)
..  Cynthia Carroll is a 27 y.o. at [redacted]w[redacted]d here in MAU reporting: around 7:30 pm went to restroom and noticed underwear were wet. Sat on edge of bed to take underwear off and noticed some leaking on the bed. Has not had any gushes of fluid.  Denies pain or vaginal bleeding. +FM  Pain score: 0/10 Vitals:   12/18/22 2040  BP: 120/64  Pulse: 93  Resp: 17  Temp: 98.5 F (36.9 C)  SpO2: 100%     FHT:150 Lab orders placed from triage:  UA

## 2022-12-18 NOTE — MAU Provider Note (Signed)
History     CSN: [id number]  Arrival date and time: 12/18/22 2021   Event Date/Time   First Provider Initiated Contact with Patient 12/18/22 2106      Chief Complaint  Patient presents with   Rupture of Membranes   HPI Cynthia Carroll is a 27 y.o. G2P0101 at 28w6dwho presents to MAU with concern for rupture of membranes. At 1Louinshe experienced new onset wetness in her underwear and believed she was leaking fluid onto the bed. She denies gush of fluid. Patient endorses feeling left abdominal pain when she stepped out of her car earlier today. She is especially concerned because the "round ligament pain" she experienced stepping out of her car has continued throughout the day. Pain score on initial CNM assessment is 0/10. Most recent sexual intercourse was 24 hours ago. She denies vaginal bleeding, dysuria, abnormal vaginal discharge or recent illness.  Ob history significant for eclamptic seizure and primary cesarean with P1. She receives care with [facility].  OB History     Gravida  2   Para  1   Term      Preterm  1   AB      Living  1      SAB      IAB      Ectopic      Multiple  0   Live Births  1           Past Medical History:  Diagnosis Date   Anxiety    [place]    Depression    Eclampsia affecting pregnancy     Past Surgical History:  Procedure Laterality Date   CESAREAN SECTION N/A 04/02/2016   Procedure: CESAREAN SECTION;  Surgeon: PMora Bellman MD;  [place] [facility]  Service: Obstetrics;  Laterality: N/A;   WISDOM TOOTH EXTRACTION      Family History  Problem Relation Age of Onset   Heart disease Maternal Grandfather    Celiac disease Mother    Hypertension Mother     Social History   Tobacco Use   Smoking status: Former    Packs/day: 0.50    Types: Cigarettes    Quit date: 01/30/2018    Years since quitting: 4.8   Smokeless tobacco: Never   Tobacco comments:    pt vapes  Vaping Use    Vaping Use: Every day   Substances: Nicotine  Substance Use Topics   Alcohol use: Not Currently    Comment: Occasionally   Drug use: No    Allergies: No Known Allergies  Medications Prior to Admission  Medication Sig Dispense Refill Last Dose   aspirin EC 81 MG tablet Take 81 mg by mouth daily. Swallow whole.   12/18/2022   Prenatal Vit-Fe Fumarate-FA (PRENATAL MULTIVITAMIN) TABS tablet Take 1 tablet by mouth daily at 12 noon.   12/18/2022   baclofen (LIORESAL) 10 MG tablet Take 1 tablet (10 mg total) by mouth 3 (three) times daily. 30 each 0    chlorhexidine (PERIDEX) 0.12 % solution Use as directed 15 mLs in the mouth or throat 2 (two) times daily. 120 mL 0     Review of Systems  Genitourinary:  Positive for vaginal discharge.  All other systems reviewed and are negative.  Physical Exam   Blood pressure 109/66, pulse 87, temperature 98.5 F (36.9 C), temperature source Oral, resp. rate 17, height '5\' 5"'$  (1.651 m), weight 74.6 kg, SpO2 100 %.  Physical Exam Vitals and nursing  note reviewed. Exam conducted with a chaperone present.  Constitutional:      General: She is not in acute distress.    Appearance: Normal appearance. She is not ill-appearing.  Cardiovascular:     Rate and Rhythm: Normal rate.  Pulmonary:     Effort: Pulmonary effort is normal.  Abdominal:     Comments: Gravid  Genitourinary:    Comments: Pelvic exam: External genitalia normal, vaginal walls pink and well rugated, cervix visually closed, no lesions noted. Negative pooling, negative leaking with Valsalva.   Skin:    Capillary Refill: Capillary refill takes less than 2 seconds.  Neurological:     Mental Status: She is alert and oriented to person, place, and time.  Psychiatric:        Mood and Affect: Mood normal.        Behavior: Behavior normal.        Thought Content: Thought content normal.        Judgment: Judgment normal.     MAU Course  Procedures  MDM  --Reassuring EFM: baseline  135, mod var, + accels, no decels --Toco: quiet --Intact membranes: negative pooling, negative fern, negative Amnisure  Patient Vitals for the past 24 hrs:  BP Temp Temp src Pulse Resp SpO2 Height Weight  12/18/22 2200 (!) 107/59 -- -- 82 -- -- -- --  12/18/22 2104 109/66 -- -- 87 -- -- -- --  12/18/22 2040 120/64 98.5 F (36.9 C) Oral 93 17 100 % '5\' 5"'$  (1.651 m) 74.6 kg    Orders Placed This Encounter  Procedures   Wet prep, genital   Urinalysis, Routine w reflex microscopic -Urine, Clean Catch   Amnisure rupture of membrane (rom)not at [facility]   Results for orders placed or performed during the hospital encounter of 12/18/22 (from the past 24 hour(s))  Urinalysis, Routine w reflex microscopic -Urine, Clean Catch     Status: Abnormal   Collection Time: 12/18/22  9:19 PM  Result Value Ref Range   Color, Urine COLORLESS (A) YELLOW   APPearance CLEAR CLEAR   Specific Gravity, Urine 1.003 (L) 1.005 - 1.030   pH 7.0 5.0 - 8.0   Glucose, UA NEGATIVE NEGATIVE mg/dL   Hgb urine dipstick NEGATIVE NEGATIVE   Bilirubin Urine NEGATIVE NEGATIVE   Ketones, ur NEGATIVE NEGATIVE mg/dL   Protein, ur NEGATIVE NEGATIVE mg/dL   Nitrite NEGATIVE NEGATIVE   Leukocytes,Ua NEGATIVE NEGATIVE  Amnisure rupture of membrane (rom)not at [facility]     Status: None   Collection Time: 12/18/22  9:20 PM  Result Value Ref Range   Amnisure ROM NEGATIVE   Wet prep, genital     Status: None   Collection Time: 12/18/22  9:20 PM  Result Value Ref Range   Yeast Wet Prep HPF POC NONE SEEN NONE SEEN   Trich, Wet Prep NONE SEEN NONE SEEN   Clue Cells Wet Prep HPF POC NONE SEEN NONE SEEN   WBC, Wet Prep HPF POC <10 <10   Sperm NONE SEEN    Assessment and Plan  --27 y.o. G2P0101 at [redacted]w[redacted]d --Intact membranes --Reassuring fetal surveillance --Quiet toco --Pain score 0/10 throughout encounter --Discharge home in stable condition  SDarlina Rumpf MSA, MSN, CNM 12/18/2022, 10:56 PM

## 2022-12-18 NOTE — Discharge Instructions (Signed)

## 2023-02-10 ENCOUNTER — Other Ambulatory Visit: Payer: Self-pay | Admitting: Obstetrics & Gynecology

## 2023-02-21 ENCOUNTER — Other Ambulatory Visit: Payer: Self-pay | Admitting: Obstetrics & Gynecology

## 2023-03-07 LAB — OB RESULTS CONSOLE GBS: GBS: NEGATIVE

## 2023-03-20 NOTE — Patient Instructions (Signed)
Cynthia Carroll  03/20/2023   Your procedure is scheduled on:  03/29/2023  Arrive at 0630 at [employer] [facility] on [address] at [facility]  and [employer]. You are invited to use the FREE valet parking or use the Visitor's parking deck.  Pick up the phone at the desk and dial [phone].  Call this number if you have problems the morning of surgery: [phone]  Remember:   Do not eat food:(After Midnight) Desps de medianoche.  Do not drink clear liquids: (After Midnight) Desps de medianoche.  Take these medicines the morning of surgery with A SIP OF WATER:  none   Do not wear jewelry, make-up or nail polish.  Do not wear lotions, powders, or perfumes. Do not wear deodorant.  Do not shave 48 hours prior to surgery.  Do not bring valuables to the hospital.  [facility] is not   responsible for any belongings or valuables brought to the hospital.  Contacts, dentures or bridgework may not be worn into surgery.  Leave suitcase in the car. After surgery it may be brought to your room.  For patients admitted to the hospital, checkout time is 11:00 AM the day of              discharge.      Please read over the following fact sheets that you were given:     Preparing for Surgery

## 2023-03-21 ENCOUNTER — Encounter (HOSPITAL_COMMUNITY): Payer: Self-pay

## 2023-03-27 ENCOUNTER — Encounter (HOSPITAL_COMMUNITY)
Admission: RE | Admit: 2023-03-27 | Discharge: 2023-03-27 | Disposition: A | Discharge status: Home / Self Care | Payer: Commercial Managed Care - PPO | Source: Ambulatory Visit | Attending: Obstetrics & Gynecology | Admitting: Obstetrics & Gynecology

## 2023-03-27 DIAGNOSIS — Z01812 Encounter for preprocedural laboratory examination: Secondary | ICD-10-CM | POA: Insufficient documentation

## 2023-03-27 LAB — CBC
HCT: 34.9 % — ABNORMAL LOW (ref 36.0–46.0)
Hemoglobin: 11.3 g/dL — ABNORMAL LOW (ref 12.0–15.0)
MCH: 29.4 pg (ref 26.0–34.0)
MCHC: 32.4 g/dL (ref 30.0–36.0)
MCV: 90.9 fL (ref 80.0–100.0)
Platelets: 249 10*3/uL (ref 150–400)
RBC: 3.84 MIL/uL — ABNORMAL LOW (ref 3.87–5.11)
RDW: 12.7 % (ref 11.5–15.5)
WBC: 5.6 10*3/uL (ref 4.0–10.5)
nRBC: 0 % (ref 0.0–0.2)

## 2023-03-27 LAB — TYPE AND SCREEN
ABO/RH(D): O POS
Antibody Screen: NEGATIVE

## 2023-03-28 ENCOUNTER — Encounter (HOSPITAL_COMMUNITY): Payer: Self-pay | Admitting: Obstetrics & Gynecology

## 2023-03-28 LAB — RPR: RPR Ser Ql: NONREACTIVE

## 2023-03-28 NOTE — Anesthesia Preprocedure Evaluation (Signed)
Anesthesia Evaluation  Patient identified by MRN, date of birth, ID band Patient awake    Reviewed: Allergy & Precautions, NPO status , Patient's Chart, lab work & pertinent test results  Airway Mallampati: III       Dental no notable dental hx. (+) Teeth Intact, Dental Advisory Given   Pulmonary former smoker   Pulmonary exam normal breath sounds clear to auscultation       Cardiovascular hypertension, Normal cardiovascular exam Rhythm:Regular Rate:Normal     Neuro/Psych Seizures -, Well Controlled,  PSYCHIATRIC DISORDERS Anxiety Depression Bipolar Disorder   Hx/o eclampsia with prior pregnancy    GI/Hepatic Neg liver ROS,GERD  ,,  Endo/Other  Hypercholesterolemia  Renal/GU negative Renal ROS  negative genitourinary   Musculoskeletal negative musculoskeletal ROS (+)    Abdominal  (+) + obese  Peds  Hematology  (+) Blood dyscrasia, anemia   Anesthesia Other Findings   Reproductive/Obstetrics (+) Pregnancy Hx/o previous C/Section Desires sterilization                              Anesthesia Physical Anesthesia Plan  ASA: 2  Anesthesia Plan: Spinal   Post-op Pain Management: Minimal or no pain anticipated   Induction: Intravenous  PONV Risk Score and Plan: 4 or greater and Treatment may vary due to age or medical condition and Scopolamine patch - Pre-op  Airway Management Planned: Natural Airway  Additional Equipment:   Intra-op Plan:   Post-operative Plan:   Informed Consent: I have reviewed the patients History and Physical, chart, labs and discussed the procedure including the risks, benefits and alternatives for the proposed anesthesia with the patient or authorized representative who has indicated his/her understanding and acceptance.     Dental advisory given  Plan Discussed with: Anesthesiologist and CRNA  Anesthesia Plan Comments:          Anesthesia  Quick Evaluation

## 2023-03-29 ENCOUNTER — Encounter (HOSPITAL_COMMUNITY)
Admission: RE | Disposition: A | Discharge status: Home / Self Care | Payer: Self-pay | Source: Home / Self Care | Attending: Obstetrics & Gynecology

## 2023-03-29 ENCOUNTER — Other Ambulatory Visit: Payer: Self-pay

## 2023-03-29 ENCOUNTER — Inpatient Hospital Stay (HOSPITAL_COMMUNITY): Payer: Commercial Managed Care - PPO | Admitting: Anesthesiology

## 2023-03-29 ENCOUNTER — Inpatient Hospital Stay (HOSPITAL_COMMUNITY)
Admission: RE | Admit: 2023-03-29 | Discharge: 2023-03-31 | DRG: 784 | Disposition: A | Discharge status: Home / Self Care | Payer: Commercial Managed Care - PPO | Attending: Obstetrics & Gynecology | Admitting: Obstetrics & Gynecology

## 2023-03-29 ENCOUNTER — Encounter (HOSPITAL_COMMUNITY): Payer: Self-pay | Admitting: Obstetrics & Gynecology

## 2023-03-29 DIAGNOSIS — O164 Unspecified maternal hypertension, complicating childbirth: Secondary | ICD-10-CM

## 2023-03-29 DIAGNOSIS — O9902 Anemia complicating childbirth: Secondary | ICD-10-CM | POA: Diagnosis not present

## 2023-03-29 DIAGNOSIS — Z3A00 Weeks of gestation of pregnancy not specified: Secondary | ICD-10-CM | POA: Diagnosis not present

## 2023-03-29 DIAGNOSIS — Z3A39 39 weeks gestation of pregnancy: Secondary | ICD-10-CM

## 2023-03-29 DIAGNOSIS — Z302 Encounter for sterilization: Secondary | ICD-10-CM

## 2023-03-29 DIAGNOSIS — D62 Acute posthemorrhagic anemia: Secondary | ICD-10-CM | POA: Diagnosis not present

## 2023-03-29 DIAGNOSIS — Z8759 Personal history of other complications of pregnancy, childbirth and the puerperium: Secondary | ICD-10-CM

## 2023-03-29 DIAGNOSIS — Z98891 History of uterine scar from previous surgery: Secondary | ICD-10-CM

## 2023-03-29 DIAGNOSIS — O34211 Maternal care for low transverse scar from previous cesarean delivery: Principal | ICD-10-CM | POA: Diagnosis present

## 2023-03-29 DIAGNOSIS — D649 Anemia, unspecified: Secondary | ICD-10-CM

## 2023-03-29 DIAGNOSIS — O34219 Maternal care for unspecified type scar from previous cesarean delivery: Secondary | ICD-10-CM

## 2023-03-29 DIAGNOSIS — O321XX0 Maternal care for breech presentation, not applicable or unspecified: Secondary | ICD-10-CM | POA: Diagnosis present

## 2023-03-29 DIAGNOSIS — O9081 Anemia of the puerperium: Secondary | ICD-10-CM | POA: Diagnosis not present

## 2023-03-29 DIAGNOSIS — Z87891 Personal history of nicotine dependence: Secondary | ICD-10-CM

## 2023-03-29 SURGERY — Surgical Case
Anesthesia: Spinal

## 2023-03-29 MED ORDER — PHENYLEPHRINE HCL-NACL 20-0.9 MG/250ML-% IV SOLN
INTRAVENOUS | Status: AC
  Filled 2023-03-29: qty 250

## 2023-03-29 MED ORDER — ACETAMINOPHEN 325 MG PO TABS
650.0000 mg | ORAL_TABLET | ORAL | Status: DC | PRN
  Administered 2023-03-29 – 2023-03-30 (×3): 650 mg via ORAL
  Filled 2023-03-29 (×3): qty 2

## 2023-03-29 MED ORDER — STERILE WATER FOR IRRIGATION IR SOLN
Status: DC | PRN
  Administered 2023-03-29: 1

## 2023-03-29 MED ORDER — ACETAMINOPHEN 10 MG/ML IV SOLN
INTRAVENOUS | Status: AC
  Filled 2023-03-29: qty 200

## 2023-03-29 MED ORDER — OXYCODONE HCL 5 MG PO TABS: 5.0000 mg | ORAL_TABLET | ORAL | Status: DC | PRN

## 2023-03-29 MED ORDER — SCOPOLAMINE 1 MG/3DAYS TD PT72
MEDICATED_PATCH | TRANSDERMAL | Status: AC
  Filled 2023-03-29: qty 1

## 2023-03-29 MED ORDER — TRANEXAMIC ACID-NACL 1000-0.7 MG/100ML-% IV SOLN
INTRAVENOUS | Status: DC | PRN
  Administered 2023-03-29: 1000 mg via INTRAVENOUS

## 2023-03-29 MED ORDER — OXYTOCIN-SODIUM CHLORIDE 30-0.9 UT/500ML-% IV SOLN
INTRAVENOUS | Status: AC
  Filled 2023-03-29: qty 500

## 2023-03-29 MED ORDER — TRANEXAMIC ACID-NACL 1000-0.7 MG/100ML-% IV SOLN
INTRAVENOUS | Status: AC
  Filled 2023-03-29: qty 100

## 2023-03-29 MED ORDER — CEFAZOLIN SODIUM-DEXTROSE 2-4 GM/100ML-% IV SOLN
INTRAVENOUS | Status: AC
  Filled 2023-03-29: qty 100

## 2023-03-29 MED ORDER — OXYTOCIN-SODIUM CHLORIDE 30-0.9 UT/500ML-% IV SOLN
INTRAVENOUS | Status: DC | PRN
  Administered 2023-03-29: 200 mL via INTRAVENOUS

## 2023-03-29 MED ORDER — DIPHENHYDRAMINE HCL 25 MG PO CAPS: 25.0000 mg | ORAL_CAPSULE | Freq: Four times a day (QID) | ORAL | Status: DC | PRN

## 2023-03-29 MED ORDER — ACETAMINOPHEN 10 MG/ML IV SOLN
INTRAVENOUS | Status: DC | PRN
  Administered 2023-03-29: 1000 mg via INTRAVENOUS

## 2023-03-29 MED ORDER — IBUPROFEN 600 MG PO TABS
600.0000 mg | ORAL_TABLET | Freq: Four times a day (QID) | ORAL | Status: DC
  Administered 2023-03-30 – 2023-03-31 (×5): 600 mg via ORAL
  Filled 2023-03-29 (×5): qty 1

## 2023-03-29 MED ORDER — OXYTOCIN-SODIUM CHLORIDE 30-0.9 UT/500ML-% IV SOLN
2.5000 [IU]/h | INTRAVENOUS | Status: AC
  Administered 2023-03-29: 2.5 [IU]/h via INTRAVENOUS

## 2023-03-29 MED ORDER — SOD CITRATE-CITRIC ACID 500-334 MG/5ML PO SOLN
30.0000 mL | Freq: Once | ORAL | Status: AC
  Administered 2023-03-29: 30 mL via ORAL

## 2023-03-29 MED ORDER — FENTANYL CITRATE (PF) 100 MCG/2ML IJ SOLN
INTRAMUSCULAR | Status: AC
  Filled 2023-03-29: qty 2

## 2023-03-29 MED ORDER — SODIUM CHLORIDE 0.9 % IR SOLN
Status: DC | PRN
  Administered 2023-03-29: 1

## 2023-03-29 MED ORDER — FENTANYL CITRATE (PF) 100 MCG/2ML IJ SOLN
INTRAMUSCULAR | Status: DC | PRN
  Administered 2023-03-29: 15 ug via INTRATHECAL

## 2023-03-29 MED ORDER — SIMETHICONE 80 MG PO CHEW: 80.0000 mg | CHEWABLE_TABLET | ORAL | Status: DC | PRN

## 2023-03-29 MED ORDER — PRENATAL MULTIVITAMIN CH
1.0000 | ORAL_TABLET | Freq: Every day | ORAL | Status: DC
  Administered 2023-03-29 – 2023-03-31 (×3): 1 via ORAL
  Filled 2023-03-29 (×3): qty 1

## 2023-03-29 MED ORDER — MENTHOL 3 MG MT LOZG: 1.0000 | LOZENGE | OROMUCOSAL | Status: DC | PRN

## 2023-03-29 MED ORDER — SCOPOLAMINE 1 MG/3DAYS TD PT72
1.0000 | MEDICATED_PATCH | TRANSDERMAL | Status: DC
  Administered 2023-03-29: 1.5 mg via TRANSDERMAL

## 2023-03-29 MED ORDER — SOD CITRATE-CITRIC ACID 500-334 MG/5ML PO SOLN
ORAL | Status: AC
  Filled 2023-03-29: qty 30

## 2023-03-29 MED ORDER — CEFAZOLIN SODIUM-DEXTROSE 2-4 GM/100ML-% IV SOLN
2.0000 g | INTRAVENOUS | Status: AC
  Administered 2023-03-29: 2 g via INTRAVENOUS

## 2023-03-29 MED ORDER — SIMETHICONE 80 MG PO CHEW
80.0000 mg | CHEWABLE_TABLET | Freq: Three times a day (TID) | ORAL | Status: DC
  Administered 2023-03-29 – 2023-03-31 (×5): 80 mg via ORAL
  Filled 2023-03-29 (×5): qty 1

## 2023-03-29 MED ORDER — MORPHINE SULFATE (PF) 0.5 MG/ML IJ SOLN
INTRAMUSCULAR | Status: AC
  Filled 2023-03-29: qty 10

## 2023-03-29 MED ORDER — PHENYLEPHRINE HCL-NACL 20-0.9 MG/250ML-% IV SOLN
INTRAVENOUS | Status: DC | PRN
  Administered 2023-03-29: 30 ug/min via INTRAVENOUS

## 2023-03-29 MED ORDER — ONDANSETRON HCL 4 MG/2ML IJ SOLN
INTRAMUSCULAR | Status: DC | PRN
  Administered 2023-03-29: 4 mg via INTRAVENOUS

## 2023-03-29 MED ORDER — POVIDONE-IODINE 10 % EX SWAB
2.0000 | Freq: Once | CUTANEOUS | Status: AC
  Administered 2023-03-29: 2 via TOPICAL

## 2023-03-29 MED ORDER — KETOROLAC TROMETHAMINE 30 MG/ML IJ SOLN
30.0000 mg | Freq: Four times a day (QID) | INTRAMUSCULAR | Status: AC
  Administered 2023-03-29 – 2023-03-30 (×4): 30 mg via INTRAVENOUS
  Filled 2023-03-29 (×4): qty 1

## 2023-03-29 MED ORDER — COCONUT OIL OIL: 1.0000 | TOPICAL_OIL | Status: DC | PRN

## 2023-03-29 MED ORDER — BUPIVACAINE IN DEXTROSE 0.75-8.25 % IT SOLN
INTRATHECAL | Status: DC | PRN
  Administered 2023-03-29: 1.9 mL via INTRATHECAL

## 2023-03-29 MED ORDER — MORPHINE SULFATE (PF) 0.5 MG/ML IJ SOLN
INTRAMUSCULAR | Status: DC | PRN
  Administered 2023-03-29: .15 mg via INTRATHECAL

## 2023-03-29 MED ORDER — DEXAMETHASONE SODIUM PHOSPHATE 10 MG/ML IJ SOLN
INTRAMUSCULAR | Status: AC
  Filled 2023-03-29: qty 1

## 2023-03-29 MED ORDER — ONDANSETRON HCL 4 MG/2ML IJ SOLN
INTRAMUSCULAR | Status: AC
  Filled 2023-03-29: qty 2

## 2023-03-29 MED ORDER — DIBUCAINE (PERIANAL) 1 % EX OINT: 1.0000 | TOPICAL_OINTMENT | CUTANEOUS | Status: DC | PRN

## 2023-03-29 MED ORDER — WITCH HAZEL-GLYCERIN EX PADS: 1.0000 | MEDICATED_PAD | CUTANEOUS | Status: DC | PRN

## 2023-03-29 MED ORDER — LACTATED RINGERS IV SOLN: INTRAVENOUS | Status: DC

## 2023-03-29 MED ORDER — SENNOSIDES-DOCUSATE SODIUM 8.6-50 MG PO TABS
2.0000 | ORAL_TABLET | Freq: Every day | ORAL | Status: DC
  Administered 2023-03-30 – 2023-03-31 (×2): 2 via ORAL
  Filled 2023-03-29 (×2): qty 2

## 2023-03-29 MED ORDER — ZOLPIDEM TARTRATE 5 MG PO TABS: 5.0000 mg | ORAL_TABLET | Freq: Every evening | ORAL | Status: DC | PRN

## 2023-03-29 MED ORDER — DEXAMETHASONE SODIUM PHOSPHATE 10 MG/ML IJ SOLN
INTRAMUSCULAR | Status: DC | PRN
  Administered 2023-03-29: 10 mg via INTRAVENOUS

## 2023-03-29 SURGICAL SUPPLY — 38 items
APL SKNCLS STERI-STRIP NONHPOA (GAUZE/BANDAGES/DRESSINGS) ×1
BENZOIN TINCTURE PRP APPL 2/3 (GAUZE/BANDAGES/DRESSINGS) IMPLANT
CHLORAPREP W/TINT 26ML (MISCELLANEOUS) ×2 IMPLANT
CLAMP UMBILICAL CORD (MISCELLANEOUS) ×1 IMPLANT
CLOTH BEACON ORANGE TIMEOUT ST (SAFETY) ×1 IMPLANT
CLSR STERI-STRIP ANTIMIC 1/2X4 (GAUZE/BANDAGES/DRESSINGS) IMPLANT
DRSG OPSITE POSTOP 4X10 (GAUZE/BANDAGES/DRESSINGS) ×1 IMPLANT
ELECT REM PT RETURN 9FT ADLT (ELECTROSURGICAL) ×1
ELECTRODE REM PT RTRN 9FT ADLT (ELECTROSURGICAL) ×1 IMPLANT
EXTRACTOR VACUUM KIWI (MISCELLANEOUS) IMPLANT
EXTRACTOR VACUUM M CUP 4 TUBE (SUCTIONS) IMPLANT
GAUZE PAD ABD 7.5X8 STRL (GAUZE/BANDAGES/DRESSINGS) IMPLANT
GAUZE SPONGE 4X4 12PLY STRL LF (GAUZE/BANDAGES/DRESSINGS) IMPLANT
GLOVE BIO SURGEON STRL SZ7 (GLOVE) ×1 IMPLANT
GLOVE BIOGEL PI IND STRL 7.0 (GLOVE) ×3 IMPLANT
GOWN STRL REUS W/TWL LRG LVL3 (GOWN DISPOSABLE) ×3 IMPLANT
KIT ABG SYR 3ML LUER SLIP (SYRINGE) IMPLANT
LIGASURE IMPACT 36 18CM CVD LR (INSTRUMENTS) IMPLANT
MAT PREVALON FULL STRYKER (MISCELLANEOUS) IMPLANT
NDL HYPO 25X5/8 SAFETYGLIDE (NEEDLE) IMPLANT
NEEDLE HYPO 25X5/8 SAFETYGLIDE (NEEDLE) IMPLANT
NS IRRIG 1000ML POUR BTL (IV SOLUTION) ×1 IMPLANT
PACK C SECTION WH (CUSTOM PROCEDURE TRAY) ×1 IMPLANT
PAD OB MATERNITY 4.3X12.25 (PERSONAL CARE ITEMS) ×1 IMPLANT
RTRCTR C-SECT PINK 25CM LRG (MISCELLANEOUS) IMPLANT
STRIP CLOSURE SKIN 1/2X4 (GAUZE/BANDAGES/DRESSINGS) IMPLANT
SUT MNCRL 0 VIOLET CTX 36 (SUTURE) ×2 IMPLANT
SUT PLAIN 0 NONE (SUTURE) IMPLANT
SUT PLAIN 2 0 (SUTURE)
SUT PLAIN ABS 2-0 CT1 27XMFL (SUTURE) IMPLANT
SUT VIC AB 0 CT1 27 (SUTURE) ×2
SUT VIC AB 0 CT1 27XBRD ANBCTR (SUTURE) ×2 IMPLANT
SUT VIC AB 2-0 CT1 27 (SUTURE) ×1
SUT VIC AB 2-0 CT1 TAPERPNT 27 (SUTURE) ×1 IMPLANT
SUT VIC AB 4-0 KS 27 (SUTURE) ×1 IMPLANT
TOWEL OR 17X24 6PK STRL BLUE (TOWEL DISPOSABLE) ×1 IMPLANT
TRAY FOLEY W/BAG SLVR 14FR LF (SET/KITS/TRAYS/PACK) IMPLANT
WATER STERILE IRR 1000ML POUR (IV SOLUTION) ×1 IMPLANT

## 2023-03-29 NOTE — Anesthesia Postprocedure Evaluation (Signed)
Anesthesia Post Note  Patient: Cynthia Carroll  Procedure(s) Performed: Repeat CESAREAN SECTION; TUBAL BILATERAL SALPINGECTOMY     Patient location during evaluation: PACU Anesthesia Type: Spinal Level of consciousness: oriented and awake and alert Pain management: pain level controlled Vital Signs Assessment: post-procedure vital signs reviewed and stable Respiratory status: spontaneous breathing, respiratory function stable and nonlabored ventilation Cardiovascular status: blood pressure returned to baseline and stable Postop Assessment: no headache, no backache, no apparent nausea or vomiting, spinal receding and patient able to bend at knees Anesthetic complications: no   No notable events documented.  Pain Goal: Patients Stated Pain Goal: 4 (03/29/23 1051)                 Taylor Spilde A.

## 2023-03-29 NOTE — Transfer of Care (Signed)
Immediate Anesthesia Transfer of Care Note  Patient: Cynthia Carroll  Procedure(s) Performed: Repeat CESAREAN SECTION; TUBAL BILATERAL SALPINGECTOMY  Patient Location: PACU  Anesthesia Type:Spinal  Level of Consciousness: awake, alert , and oriented  Airway & Oxygen Therapy: Patient Spontanous Breathing  Post-op Assessment: Report given to RN and Post -op Vital signs reviewed and stable  Post vital signs: Reviewed and stable  Last Vitals:  Vitals Value Taken Time  BP 120/73 03/29/23 1051  Temp    Pulse 87 03/29/23 1053  Resp 20 03/29/23 1053  SpO2 100 % 03/29/23 1053  Vitals shown include unvalidated device data.  Last Pain:  Vitals:   03/29/23 9562  TempSrc: Oral         Complications: No notable events documented.

## 2023-03-29 NOTE — Anesthesia Procedure Notes (Signed)
Spinal  Patient location during procedure: OR Start time: 03/29/2023 9:19 AM End time: 03/29/2023 9:23 AM Reason for block: surgical anesthesia Staffing Performed: anesthesiologist  Anesthesiologist: Mal Amabile, MD Performed by: Mal Amabile, MD Authorized by: Mal Amabile, MD   Preanesthetic Checklist Completed: patient identified, IV checked, site marked, risks and benefits discussed, surgical consent, monitors and equipment checked, pre-op evaluation and timeout performed Spinal Block Patient position: sitting Prep: DuraPrep and site prepped and draped Patient monitoring: heart rate, cardiac monitor, continuous pulse ox and blood pressure Approach: midline Location: L3-4 Injection technique: single-shot Needle Needle type: Pencan  Needle gauge: 24 G Needle length: 9 cm Needle insertion depth: 5 cm Assessment Sensory level: T4 Events: CSF return Additional Notes Patient tolerated procedure well. Adequate sensory level.

## 2023-03-29 NOTE — Plan of Care (Signed)

## 2023-03-29 NOTE — H&P (Signed)
Cynthia Carroll is a 27 y.o. female presenting repeat C/section and tubal sterilization at 39 wks G2P0101, one preterm delivery by emergency C/section at 33.1 wks for severe preeclampsia and reports eclampsia on OR bed.   Good dating by early sono. bbASA for h/o eclampsia, normal baseline CBC, CMP and uirne p/c ratio Excessive weight gain- 48 lbs, no GDM, LGA at 34 wks- 6'6" at 95% and AC 99% and breech   Frequent UCs in 3rd trim but no preterm dilation  OB History     Gravida  2   Para  1   Term      Preterm  1   AB      Living  1      SAB      IAB      Ectopic      Multiple  0   Live Births  1          Past Medical History:  Diagnosis Date   Anxiety    Bipolar disorder (HCC)    Depression    Eclampsia affecting pregnancy    Past Surgical History:  Procedure Laterality Date   CESAREAN SECTION N/A 04/02/2016   Procedure: CESAREAN SECTION;  Surgeon: Catalina Antigua, MD;  Location: [facility];  Service: Obstetrics;  Laterality: N/A;   WISDOM TOOTH EXTRACTION     Family History: family history includes Celiac disease in her mother; Heart disease in her maternal grandfather; Thyroid disease in her mother. Social History:  reports that she quit smoking about 5 years ago. Her smoking use included cigarettes. She smoked an average of .5 packs per day. She has never used smokeless tobacco. She reports that she does not currently use alcohol. She reports that she does not use drugs.     Maternal Diabetes: No Genetic Screening: Normal NIPT Maternal Ultrasounds/Referrals: Normal Fetal Ultrasounds or other Referrals:  None Maternal Substance Abuse:  No Significant Maternal Medications:  bbASA Significant Maternal Lab Results:  Group B Strep negative Number of Prenatal Visits:greater than 3 verified prenatal visits Other Comments:  None  Review of Systems  no HA/ RUQ pain/ swelling/ vision floaters     Blood pressure 122/84, pulse (!) 103, temperature  97.9 F (36.6 C), temperature source Oral, height 5\' 5"  (1.651 m), weight 88.8 kg. Exam Physical Exam  Physical exam:  A&O x 3, no acute distress. Pleasant HEENT neg, no thyromegaly Lungs CTA bilat CV RRR, S1S2 normal Abdo soft, non tender, non acute. Gravid, Breech  Extr no edema/ tenderness Pelvic deferred  FHT  140s Toco none  Prenatal labs: ABO, Rh: --/--/O POS (06/17 1610) Antibody: NEG (06/17 0923) Rubella: Immune (11/29 0000) VZ immune RPR: NON REACTIVE (06/17 0918)  HBsAg: Negative (11/29 0000) Hep C neg  HIV: Non-reactive (11/29 0000)  GBS: Negative/-- (05/28 0000)    Assessment/Plan: 27 yo G2P0101, 39 wks, repeat elective C/section and bilateral sterilization by salpingectomy. Risk of regret and other option of long term birth control d/w pt and she desires permanent sterilization.   Risks/complications of surgery reviewed incl infection, bleeding, damage to internal organs including bladder, bowels, ureters, blood vessels, other risks from anesthesia, VTE and delayed complications of any surgery, complications in future surgery reviewed. Also discussed neonatal complications incl difficult delivery, laceration, vacuum assistance, TTN etc. Pt understands and agrees, all concerns addressed.      Robley Fries 03/29/2023, 6:47 AM

## 2023-03-29 NOTE — Lactation Note (Signed)
This note was copied from a baby's chart. Lactation Consultation Note  Patient Name: Cynthia Carroll Date: 03/29/2023 Age:29 hours Reason for consult: Initial assessment;Term  LC in to visit with P2 Mom of term baby delivered by C/Section.  Mom pumped for 2 months with her first baby who was in the NICU for a month and found to have an allergy to dairy in her breast milk.  Baby switched to formula.  Placed baby STS on Mom's chest.  Reviewed breast massage and hand expression, colostrum drops easily expressed.  Baby has fed twice and has been sleepy.  Hand expressed drop of colostrum onto nipple and baby fussy when positioned over breast.  Baby didn't root.   Encouraged STS with baby and watching for feeding cues. Mom to ask for help with latch when baby starts cueing and showing interest.  Maternal Data Has patient been taught Hand Expression?: Yes Does the patient have breastfeeding experience prior to this delivery?: Yes How long did the patient breastfeed?: 2 months  Feeding Mother's Current Feeding Choice: Breast Milk  Interventions Interventions: Breast feeding basics reviewed;Skin to skin;Breast massage;Hand express;LC Services brochure  Discharge Pump: Personal;[employer] secretary from insurance)  Consult Status Consult Status: Follow-up Date: 03/30/23 Follow-up type: In-patient    Judee Clara 03/29/2023, 3:29 PM

## 2023-03-29 NOTE — Op Note (Signed)
Cesarean Section Procedure Note   Cynthia Carroll  03/29/2023 Procedure: Repeat elective Cesarean section and bilateral sterilization with salpingectomy  Indications: Scheduled Proceedure/Maternal Request , Carroll. Sterilization request   Pre-operative Diagnosis: Previous Cesarean Section;DESIRE STERILIZATION.                                              Cynthia Carroll   Post-operative Diagnosis: Same   Surgeon: Cynthia Evans, MD   Assistants: Arlan Organ CNM   Anesthesia: spinal   Procedure Details:  The patient was seen in the Holding Room. The risks, benefits, complications, treatment options, and expected outcomes were discussed with the patient. The patient concurred with the proposed plan, giving informed consent. identified as Cynthia Carroll and the procedure verified as C-Section Delivery and bilateral tubal sterilization by salpingectomy. She brought to Operating room. A Time Out was held and the above information confirmed, 2 gm Ancef given.   After induction of anesthesia, the patient was draped and prepped in the usual sterile manner, foley was draining urine well.  A pfannenstiel incision was made and carried down through the subcutaneous tissue to the fascia. Fascial incision was made and extended transversely. The fascia was separated from the underlying rectus tissue superiorly and inferiorly. The peritoneum was identified and entered. Peritoneal incision was extended longitudinally. Alexis-O retractor placed. Bladder was high, so utero-vesical peritoneal reflection was incised transversely and the bladder flap was bluntly freed from the lower uterine segment. A low transverse uterine incision was made. Baby was in [place] Carroll position. Baby was delivered by complete Carroll extraction. Right foot reached at the fundus and leg bent at the knee, foot brought out followed by right leg. Then left foot reach and delivered, back was kept anterior and covered with moist towel.  Arms were nuchal. Body turned and left arm delivered, baby turned laterally and right arm delivered and head delivered while maintaining flexion. Baby girl was born at 9.54 am. Apgar scores of {Numbers; 0-10:33138} at one minute and {Numbers; 0-10:33138} at five minutes. Delayed cord clamping done at 1 minute and baby handed to NICU team in attendance. Cord ph was not sent. Cord blood was obtained for evaluation. The placenta was removed Intact and appeared normal.  TXA given as lower segment was bleeding including left angle. The uterine outline, tubes and ovaries appeared normal}. The uterine incision was closed with running locked sutures of . A second imbricating layer was not sutured.   Hemostasis was observed. Left colonic fat adhesions to anterior lower uterus were dissected and now I was able to access left adnexa. Both fallopian tubes were normal and bilateral salpingectomy performed using Ligasure and tubes handed off for pathology. Hemostasis noted. Alexis retractor removed. Omental adhesion from left peritoneal edge were released. Peritoneal closure done with 2-0 Vicryl.  The fascia was then reapproximated with running sutures of 0Vicryl. The subcuticular closure was performed using 2-0plain gut. The skin was closed with 4-0Vicryl. Steristrips and honeycomb placed.  Instrument, sponge, and needle counts were correct prior the abdominal closure and were correct at the conclusion of the case.   Findings: Cynthia Carroll delivery from [facility] hysterotomy. Left colon fat adhesions to anterior lateral lower uterus, released to get to left adnexa. Normal tubes and ovaries. Blood loss high from lower segment vascularity and left angle bleeding. Well controlled with sutures and TXA given.  Estimated Blood Loss: 1064 cc  Total IV Fluids: 1200 cc LR   Urine Output: 200 cc clear urine in foley  Specimens: both fallopian tubes and cord blood   Complications: no complications  Disposition: PACU -  hemodynamically stable.   Maternal Condition: stable   Baby condition / location:  Couplet care / Skin to Skin  Attending Attestation: I performed the procedure.   Signed: Surgeon(s): Cynthia Evans, MD

## 2023-03-30 ENCOUNTER — Encounter (HOSPITAL_COMMUNITY): Payer: Self-pay | Admitting: Obstetrics & Gynecology

## 2023-03-30 DIAGNOSIS — O9902 Anemia complicating childbirth: Secondary | ICD-10-CM | POA: Diagnosis present

## 2023-03-30 LAB — COMPREHENSIVE METABOLIC PANEL
ALT: 13 U/L (ref 0–44)
AST: 19 U/L (ref 15–41)
Albumin: 2 g/dL — ABNORMAL LOW (ref 3.5–5.0)
Alkaline Phosphatase: 128 U/L — ABNORMAL HIGH (ref 38–126)
Anion gap: 6 (ref 5–15)
BUN: 9 mg/dL (ref 6–20)
CO2: 22 mmol/L (ref 22–32)
Calcium: 8.4 mg/dL — ABNORMAL LOW (ref 8.9–10.3)
Chloride: 104 mmol/L (ref 98–111)
Creatinine, Ser: 0.61 mg/dL (ref 0.44–1.00)
GFR, Estimated: >60 mL/min (ref >60)
Glucose, Bld: 84 mg/dL (ref 70–99)
Potassium: 3.7 mmol/L (ref 3.5–5.1)
Sodium: 132 mmol/L — ABNORMAL LOW (ref 135–145)
Total Bilirubin: 0.2 mg/dL — ABNORMAL LOW (ref 0.3–1.2)
Total Protein: 4.8 g/dL — ABNORMAL LOW (ref 6.5–8.1)

## 2023-03-30 LAB — CBC
HCT: 26.4 % — ABNORMAL LOW (ref 36.0–46.0)
Hemoglobin: 8.6 g/dL — ABNORMAL LOW (ref 12.0–15.0)
MCH: 29.1 pg (ref 26.0–34.0)
MCHC: 32.6 g/dL (ref 30.0–36.0)
MCV: 89.2 fL (ref 80.0–100.0)
Platelets: 223 10*3/uL (ref 150–400)
RBC: 2.96 MIL/uL — ABNORMAL LOW (ref 3.87–5.11)
RDW: 12.5 % (ref 11.5–15.5)
WBC: 10.6 10*3/uL — ABNORMAL HIGH (ref 4.0–10.5)
nRBC: 0 % (ref 0.0–0.2)

## 2023-03-30 LAB — BIRTH TISSUE RECOVERY COLLECTION (PLACENTA DONATION)

## 2023-03-30 MED ORDER — MAGNESIUM OXIDE -MG SUPPLEMENT 400 (240 MG) MG PO TABS
400.0000 mg | ORAL_TABLET | Freq: Every day | ORAL | Status: DC
  Administered 2023-03-30 – 2023-03-31 (×2): 400 mg via ORAL
  Filled 2023-03-30 (×2): qty 1

## 2023-03-30 MED ORDER — POLYSACCHARIDE IRON COMPLEX 150 MG PO CAPS
150.0000 mg | ORAL_CAPSULE | ORAL | Status: DC
  Administered 2023-03-30: 150 mg via ORAL
  Filled 2023-03-30: qty 1

## 2023-03-30 NOTE — Progress Notes (Signed)
      Subjective: POD# 1 Live born female  Birth Weight: 9 lb 7 oz (4280 g) APGAR: 8, 9  Newborn Delivery   Birth date/time: 03/29/2023 09:54:26 Delivery type: C-Section, Low Transverse Trial of labor: No C-section categorization: Repeat     Baby name: Ariel Delivering provider: MODY, VAISHALI  Feeding: breast  Pain control at delivery: Spinal   Reports feeling very well  Patient reports tolerating PO.   Breast symptoms:breastfeeding well Pain controlled with  PO meds Denies HA/SOB/C/P/N/V/dizziness. Flatus present. She reports vaginal bleeding as normal, without clots.  She is ambulating, urinating without difficulty.     Objective:   VS:    Vitals:   03/29/23 1830 03/29/23 2301 03/30/23 0216 03/30/23 0457  BP:  99/64 108/73 (!) 98/56  Pulse: 68 (!) 56 60 63  Resp: 18 16 16 16   Temp: 98.2 F (36.8 C) 98.9 F (37.2 C) 98 F (36.7 C) 97.9 F (36.6 C)  TempSrc: Oral Oral Oral Oral  SpO2: 100% 100% 100% 100%  Weight:      Height:         Intake/Output Summary (Last 24 hours) at 03/30/2023 0823 Last data filed at 03/30/2023 0513 Gross per 24 hour  Intake 1321.63 ml  Output 4052 ml  Net -2730.37 ml        Recent Labs    03/27/23 0918 03/30/23 0620  WBC 5.6 10.6*  HGB 11.3* 8.6*  HCT 34.9* 26.4*  PLT 249 223     Blood type: --/--/O POS (06/17 6045)  Rubella: Immune (11/29 0000)    Physical Exam:  General: alert, cooperative, and no distress CV: Regular rate and rhythm Resp: clear Abdomen: soft, nontender, normal bowel sounds Incision:  pressure dressing intact Uterine Fundus: firm, below umbilicus, nontender Lochia: minimal Ext: no edema, redness or tenderness in the calves or thighs  Assessment/Plan: 27 y.o.   POD# 1. [id number]                  Principal Problem:   Postpartum care following cesarean delivery - 6/19 Active Problems:   History of eclampsia   Previous cesarean section   Cesarean delivery - Repeat   Status post repeat low  transverse cesarean section   Maternal anemia, with delivery - IDA w/ ABL  - asymptomatic  - start oral Fe and Mag ox  Doing well, stable.               Advance diet as tolerated Encourage rest when baby rests Breastfeeding support Encourage to ambulate Routine post-op care  Neta Mends, CNM, MSN 03/30/2023, 8:23 AM

## 2023-03-30 NOTE — Clinical Social Work Maternal (Signed)
CLINICAL SOCIAL WORK MATERNAL/CHILD NOTE  Patient Details  Name: Cynthia Carroll MRN: [id number] Date of Birth: 04/27/1996  Date:  03/30/2023  Clinical Social Worker Initiating Note:  Thais Sidonia Nutter, LCSW Date/Time: Initiated:  03/30/23/1138     Child's Name:  Ariel Carter   Biological Parents:  Mother, Father (Father: Kenny Carter)   Need for Interpreter:  None   Reason for Referral:  Behavioral Health Concerns   Address:  [address] [place] [address]    Phone number:  [phone] (home) [phone] (work)    Additional phone number:   Household Members/Support Persons (HM/SP):   Household Member/Support Person 1, Household Member/Support Person 2   HM/SP Name Relationship DOB or Age  HM/SP -1 Kenny Carter FOB    HM/SP -2 [place] Bet[place] son 04/02/16  HM/SP -3        HM/SP -4        HM/SP -5        HM/SP -6        HM/SP -7        HM/SP -8          Natural Supports (not living in the home):  Extended Family, Immediate Family   Professional Supports: None   Employment: Full-time   Type of Work: Safety Support Specialist   Education:  Some College   Homebound arranged:    Financial Resources:  Private Insurance    Other Resources:      Cultural/Religious Considerations Which May Impact Care:    Strengths:  Ability to meet basic needs  , Home prepared for child     Psychotropic Medications:         Pediatrician:     Dr. Kerns   Pediatrician List:   [place]    [place]    [place]    [place]    [place]    [place]      Pediatrician Fax Number:    Risk Factors/Current Problems:  Mental Health Concerns     Cognitive State:  Able to Concentrate  , Alert  , Linear Thinking  , Goal Oriented     Mood/Affect:  Comfortable  , Calm  , Happy  , Interested     CSW Assessment: CSW met with MOB at bedside to complete psychosocial assessment, FOB present. CSW introduced self. MOB granted CSW verbal  permission to speak in front of FOB about anything. CSW explained reason for consult. MOB was welcoming, pleasant, open, and remained engaged during assessment. MOB reported that she resides with FOB and son. MOB reported that they have all items needed to care for infant including a car seat, crib, and bassinet. CSW inquired about MOB's support system, MOB reported that her whole family is supportive.   CSW inquired about MOB's mental health history. MOB reported that she was diagnosed with anxiety and depression at age 16. MOB reported that she was on medication at one time but learned how to cope without medication. MOB denied any current symptoms of anxiety/depression. MOB reported that she is not taking any medication nor participating in therapy to treat anxiety/depression. MOB denied needing any therapy resources. CSW inquired about Bipolar diagnosis mentioned in patient's chart. MOB reported that this was an inaccurate diagnosis and explained that she had an adverse reaction to psychotropic medication when she received the diagnosis. MOB denied any additional mental health history. MOB denied any history of postpartum depression. CSW inquired about how MOB was feeling emotionally since giving birth,   MOB reported that she was feeling great. MOB presented calm and did not demonstrate any acute mental health signs/symptoms. CSW assessed for safety, MOB denied SI and HI. CSW did not assess for domestic violence as FOB was present.   CSW provided education regarding the baby blues period vs. perinatal mood disorders, discussed treatment and gave resources for mental health follow up if concerns arise.  CSW recommends self-evaluation during the postpartum time period using the New Mom Checklist from Postpartum Progress and encouraged MOB to contact a medical professional if symptoms are noted at any time.    CSW provided review of Sudden Infant Death Syndrome (SIDS) precautions.    CSW identifies no  further need for intervention and no barriers to discharge at this time.   CSW Plan/Description:  Sudden Infant Death Syndrome (SIDS) Education, Perinatal Mood and Anxiety Disorder (PMADs) Education, No Further Intervention Required/No Barriers to Discharge    Rendy L Nariya Neumeyer, LCSW 03/30/2023, 11:40 AM 

## 2023-03-30 NOTE — Plan of Care (Signed)

## 2023-03-31 DIAGNOSIS — D62 Acute posthemorrhagic anemia: Secondary | ICD-10-CM | POA: Diagnosis not present

## 2023-03-31 LAB — SURGICAL PATHOLOGY

## 2023-03-31 MED ORDER — POLYSACCHARIDE IRON COMPLEX 150 MG PO CAPS: 150.0000 mg | ORAL_CAPSULE | ORAL | Status: AC

## 2023-03-31 MED ORDER — OXYCODONE HCL 5 MG PO TABS: 5.0000 mg | ORAL_TABLET | Freq: Four times a day (QID) | ORAL | 0 refills | Status: AC | PRN

## 2023-03-31 MED ORDER — SENNOSIDES-DOCUSATE SODIUM 8.6-50 MG PO TABS: 2.0000 | ORAL_TABLET | Freq: Every day | ORAL | Status: AC

## 2023-03-31 MED ORDER — IBUPROFEN 600 MG PO TABS: 600.0000 mg | ORAL_TABLET | Freq: Four times a day (QID) | ORAL | 0 refills | Status: AC

## 2023-03-31 MED ORDER — ACETAMINOPHEN 325 MG PO TABS: 650.0000 mg | ORAL_TABLET | ORAL | Status: AC | PRN

## 2023-03-31 NOTE — Discharge Summary (Signed)
Postpartum Discharge Summary     Patient Name: Cynthia Carroll DOB: Aug 23, 1996 MRN: [id number]  Date of admission: 03/29/2023 Delivery date:03/29/2023  Delivering provider: Calah Gershman  Date of discharge: 03/31/2023  Admitting diagnosis: Previous cesarean section [Z98.891] Breech Sterilzation request [redacted]w[redacted]d      Secondary diagnosis:  Principal Problem:   Postpartum care following cesarean delivery - 6/19 - Status post repeat low transverse cesarean section and bilateral salpingectomy for sterilization  Active Problems:   History of eclampsia in prior pregnancy    Previous cesarean section   Maternal anemia, with delivery - IDA w/ acute blood loss anemia     Discharge diagnosis: Term Pregnancy Delivered                                              Post partum procedures: none  Augmentation: N/A Complications: None  Hospital course: Sceduled C/S   27 y.o. yo G2P1102 at [redacted]w[redacted]d was admitted to the hospital 03/29/2023 for scheduled cesarean section with the following indication:Elective Repeat and Malpresentation.Delivery details are as follows:  Membrane Rupture Time/Date: 9:52 AM ,03/29/2023   Delivery Method:C-Section, Low Transverse  Details of operation can be found in separate operative note.  Patient had a postpartum course complicated by mild anemia.  She is ambulating, tolerating a regular diet, passing flatus, and urinating well. Patient is discharged home in stable condition on  03/31/23        Newborn Data: Birth date:03/29/2023  Birth time:9:54 AM  Gender:Female  Living status:Living  Apgars:8 ,9  Weight:4280 g     Magnesium Sulfate received: NA BMZ received: NA Rhophylac:N/A MMR:No T-DaP:Given prenatally Flu: N/A Transfusion:No  Physical exam  Vitals:   03/30/23 0457 03/30/23 1244 03/30/23 2017 03/31/23 0547  BP: (!) 98/56 101/64 (!) 100/58 101/69  Pulse: 63 79 (!) 54 81  Resp: 16  16 16   Temp: 97.9 F (36.6 C) 98 F (36.7 C) 97.7 F (36.5 C)  98.2 F (36.8 C)  TempSrc: Oral Oral Oral Oral  SpO2: 100% 100% 100% 99%  Weight:      Height:       General: alert, cooperative, and no distress Lochia: appropriate Uterine Fundus: firm Incision: Healing well with no significant drainage, No significant erythema DVT Evaluation: No evidence of DVT seen on physical exam. Negative Homan's sign. Labs: Lab Results  Component Value Date   WBC 10.6 (H) 03/30/2023   HGB 8.6 (L) 03/30/2023   HCT 26.4 (L) 03/30/2023   MCV 89.2 03/30/2023   PLT 223 03/30/2023      Latest Ref Rng & Units 03/30/2023    6:20 AM  CMP  Glucose 70 - 99 mg/dL 84   BUN 6 - 20 mg/dL 9   Creatinine 4.09 - 8.11 mg/dL 9.14   Sodium 782 - 956 mmol/L 132   Potassium 3.5 - 5.1 mmol/L 3.7   Chloride 98 - 111 mmol/L 104   CO2 22 - 32 mmol/L 22   Calcium 8.9 - 10.3 mg/dL 8.4   Total Protein 6.5 - 8.1 g/dL 4.8   Total Bilirubin 0.3 - 1.2 mg/dL 0.2   Alkaline Phos 38 - 126 U/L 128   AST 15 - 41 U/L 19   ALT 0 - 44 U/L 13    Edinburgh Score:    03/29/2023    1:45 PM  Edinburgh Postnatal Depression Scale  Screening Tool  I have been able to laugh and see the funny side of things. 0  I have looked forward with enjoyment to things. 0  I have blamed myself unnecessarily when things went wrong. 0  I have been anxious or worried for no good reason. 0  I have felt scared or panicky for no good reason. 0  Things have been getting on top of me. 0  I have been so unhappy that I have had difficulty sleeping. 0  I have felt sad or miserable. 0  I have been so unhappy that I have been crying. 0  The thought of harming myself has occurred to me. 0  Edinburgh Postnatal Depression Scale Total 0     After visit meds:  Allergies as of 03/31/2023       Reactions   Lorazepam Other (See Comments)   Agitation        Medication List     TAKE these medications    acetaminophen 325 MG tablet Commonly known as: TYLENOL Take 2 tablets (650 mg total) by mouth every 4  (four) hours as needed for mild pain (temperature > 101.5.).   ibuprofen 600 MG tablet Commonly known as: ADVIL Take 1 tablet (600 mg total) by mouth every 6 (six) hours.   iron polysaccharides 150 MG capsule Commonly known as: NIFEREX Take 1 capsule (150 mg total) by mouth every other day. Start taking on: April 01, 2023   Mag-200 200 MG Tabs Generic drug: Magnesium Oxide -Mg Supplement Take 200 mg by mouth at bedtime.   MAGNESIUM EXTRA STRENGTH PO Apply 1 Application topically as needed (restless leg syndrome). Magnesium Leg Cream   oxyCODONE 5 MG immediate release tablet Commonly known as: Oxy IR/ROXICODONE Take 1 tablet (5 mg total) by mouth every 6 (six) hours as needed for moderate pain.   prenatal multivitamin Tabs tablet Take 1 tablet by mouth in the morning.   senna-docusate 8.6-50 MG tablet Commonly known as: Senokot-S Take 2 tablets by mouth daily.               Discharge Care Instructions  (From admission, onward)           Start     Ordered   03/31/23 0000  Discharge wound care:       Comments: Remove honeycomb dressing when baby is 61 days old   03/31/23 0943             Discharge home in stable condition Infant Feeding: Bottle and Breast Infant Disposition:home with mother Discharge instruction: per After Visit Summary and Postpartum booklet. Activity: Advance as tolerated. Pelvic rest for 6 weeks.  Diet: routine diet Future Appointments:No future appointments. Follow up Visit: 6 weeks with Dr Juliene Pina    Please schedule this patient for a postpartum visit in 6 weeks with the following provider: Shea Evans  MD. Additional Postpartum F/U: call office or message on portal your blood pressures in 3-4 days      03/31/2023 Robley Fries, MD

## 2023-03-31 NOTE — Progress Notes (Signed)
      Subjective: POD# 2 Live born female  Birth Weight: 9 lb 7 oz (4280 g) APGAR: 8, 9  Newborn Delivery   Birth date/time: 03/29/2023 09:54:26 Delivery type: C-Section, Low Transverse Trial of labor: No C-section categorization: Repeat     Baby name: Ariel Delivering provider: Jonea Bukowski  Feeding: breast  Pain control at delivery: Spinal   Reports feeling very well  Patient reports tolerating PO.   Breast symptoms:breastfeeding well Pain controlled with  PO meds Denies HA/SOB/C/P/N/V/dizziness. Flatus present. She reports vaginal bleeding as normal, without clots.  She is ambulating, urinating without difficulty.     Objective:   VS:    Vitals:   03/30/23 0457 03/30/23 1244 03/30/23 2017 03/31/23 0547  BP: (!) 98/56 101/64 (!) 100/58 101/69  Pulse: 63 79 (!) 54 81  Resp: 16  16 16   Temp: 97.9 F (36.6 C) 98 F (36.7 C) 97.7 F (36.5 C) 98.2 F (36.8 C)  TempSrc: Oral Oral Oral Oral  SpO2: 100% 100% 100% 99%  Weight:      Height:        No intake or output data in the 24 hours ending 03/31/23 1004          Latest Ref Rng & Units 03/30/2023    6:20 AM 03/27/2023    9:18 AM 07/19/2021    6:32 PM  CBC  WBC 4.0 - 10.5 K/uL 10.6  5.6  6.6   Hemoglobin 12.0 - 15.0 g/dL 8.6  69.6  29.5   Hematocrit 36.0 - 46.0 % 26.4  34.9  41.7   Platelets 150 - 400 K/uL 223  249  409       Blood type: --/--/O POS (06/17 2841)  Rubella: Immune (11/29 0000)    Physical Exam:  General: alert, cooperative, and no distress CV: Regular rate and rhythm Resp: clear Abdomen: soft, nontender, normal bowel sounds Incision:  pressure dressing intact Uterine Fundus: firm, below umbilicus, nontender Lochia: minimal Ext: no edema, redness or tenderness in the calves or thighs  Assessment/Plan: 27 y.o.   POD# 2 [id number]                  Principal Problem:   Postpartum care following cesarean delivery - 6/19 Active Problems:   History of eclampsia   Previous cesarean  section   Cesarean delivery - Repeat   Status post repeat low transverse cesarean section   Maternal anemia, with delivery - IDA w/ ABL  - asymptomatic  - start oral Fe and Mag ox  Doing well, stable.               Discharge home PP and post op care reviewed F/up 6 wks w/ Dr Juliene Pina Call office on 4-5 days or message on portal home blood pressures   Robley Fries, MD  03/31/2023, 10:04 AM

## 2023-04-19 ENCOUNTER — Telehealth (HOSPITAL_COMMUNITY): Payer: Self-pay | Admitting: *Deleted

## 2023-04-19 NOTE — Telephone Encounter (Signed)
04/19/2023  Name: JODEE WAGENAAR MRN: [id number] DOB: 1996-07-30  Reason for Call:  Transition of Care Hospital Discharge Call  Contact Status: Patient Contact Status: Complete  Language assistant needed: Interpreter Mode: Interpreter Not Needed        Follow-Up Questions: Do You Have Any Concerns About Your Health As You Heal From Delivery?: No Do You Have Any Concerns About Your Infants Health?: No  Edinburgh Postnatal Depression Scale:  In the Past 7 Days: I have been able to laugh and see the funny side of things.: As much as I always could I have looked forward with enjoyment to things.: As much as I ever did I have blamed myself unnecessarily when things went wrong.: No, never I have been anxious or worried for no good reason.: No, not at all I have felt scared or panicky for no good reason.: No, not at all Things have been getting on top of me.: No, I have been coping as well as ever I have been so unhappy that I have had difficulty sleeping.: Not at all I have felt sad or miserable.: No, not at all I have been so unhappy that I have been crying.: No, never The thought of harming myself has occurred to me.: Never Inocente Salles Postnatal Depression Scale Total: 0  PHQ2-9 Depression Scale:     Discharge Follow-up: Edinburgh score requires follow up?: No Patient was advised of the following resources:: Breastfeeding Support Group, Support Group  Post-discharge interventions: Reviewed Newborn Safe Sleep Practices  Salena Saner, RN 04/19/2023 17:18
